# Patient Record
Sex: Male | Born: 1975 | Race: Black or African American | Hispanic: No | Marital: Married | Smoking: Never smoker
Health system: Southern US, Community
[De-identification: ages and names within clinical notes are randomized; demographics above are authoritative.]

## PROBLEM LIST (undated history)

## (undated) HISTORY — PX: SHOULDER ARTHROSCOPY WITH ROTATOR CUFF REPAIR: SHX5685
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
## 1999-01-14 ENCOUNTER — Emergency Department (HOSPITAL_COMMUNITY): Admission: EM | Admit: 1999-01-14 | Discharge: 1999-01-14 | Payer: Self-pay | Admitting: *Deleted

## 1999-04-11 ENCOUNTER — Emergency Department (HOSPITAL_COMMUNITY): Admission: EM | Admit: 1999-04-11 | Discharge: 1999-04-11 | Payer: Self-pay | Admitting: Emergency Medicine

## 1999-04-12 ENCOUNTER — Encounter: Payer: Self-pay | Admitting: Emergency Medicine

## 2000-03-09 ENCOUNTER — Emergency Department (HOSPITAL_COMMUNITY): Admission: EM | Admit: 2000-03-09 | Discharge: 2000-03-09 | Payer: Self-pay | Admitting: Emergency Medicine

## 2000-03-09 ENCOUNTER — Encounter: Payer: Self-pay | Admitting: Emergency Medicine

## 2007-12-07 ENCOUNTER — Emergency Department (HOSPITAL_COMMUNITY): Admission: EM | Admit: 2007-12-07 | Discharge: 2007-12-07 | Payer: Self-pay | Admitting: Emergency Medicine

## 2008-07-24 ENCOUNTER — Emergency Department (HOSPITAL_COMMUNITY): Admission: EM | Admit: 2008-07-24 | Discharge: 2008-07-24 | Payer: Self-pay | Admitting: Emergency Medicine

## 2010-12-01 ENCOUNTER — Encounter: Admit: 2010-12-01 | Payer: Self-pay | Admitting: Physician Assistant

## 2011-10-12 ENCOUNTER — Ambulatory Visit: Payer: Self-pay | Admitting: Physician Assistant

## 2011-11-09 ENCOUNTER — Ambulatory Visit: Payer: Self-pay | Admitting: Anesthesiology

## 2011-11-09 LAB — BASIC METABOLIC PANEL
Anion Gap: 9 (ref 7–16)
BUN: 13 mg/dL (ref 7–18)
Chloride: 105 mmol/L (ref 98–107)
Creatinine: 0.75 mg/dL (ref 0.60–1.30)
EGFR (African American): >60
Glucose: 247 mg/dL — ABNORMAL HIGH (ref 65–99)
Osmolality: 286 (ref 275–301)
Potassium: 4 mmol/L (ref 3.5–5.1)

## 2011-11-10 ENCOUNTER — Ambulatory Visit: Payer: Self-pay | Admitting: Unknown Physician Specialty

## 2012-03-31 ENCOUNTER — Encounter: Payer: Self-pay | Admitting: Family Medicine

## 2012-03-31 ENCOUNTER — Ambulatory Visit (INDEPENDENT_AMBULATORY_CARE_PROVIDER_SITE_OTHER): Payer: Managed Care, Other (non HMO) | Admitting: Family Medicine

## 2012-03-31 VITALS — BP 120/70 | HR 76 | Temp 98.9°F | Wt 214.0 lb

## 2012-03-31 DIAGNOSIS — E119 Type 2 diabetes mellitus without complications: Secondary | ICD-10-CM | POA: Insufficient documentation

## 2012-03-31 DIAGNOSIS — Z136 Encounter for screening for cardiovascular disorders: Secondary | ICD-10-CM

## 2012-03-31 LAB — LIPID PANEL: Cholesterol: 185 mg/dL (ref 0–200)

## 2012-03-31 LAB — HEMOGLOBIN A1C: Hgb A1c MFr Bld: 7.6 % — ABNORMAL HIGH (ref 4.6–6.5)

## 2012-03-31 MED ORDER — GLIPIZIDE 5 MG PO TABS: 5.0000 mg | ORAL_TABLET | Freq: Every day | ORAL | Status: DC

## 2012-03-31 NOTE — Patient Instructions (Signed)
It was nice to meet you. We are checking labs today. We will call you with your lab results.

## 2012-03-31 NOTE — Progress Notes (Signed)
  Subjective:    Patient ID: Luke Abbott, male    DOB: 04/10/1976, 36 y.o.   MRN: [id number]  HPI  36 yo here to establish care.  DM- diagnosed in 2006. Does have yearly eye exams. Denies any radiculopathy or neuropathy. Does not check CBGs regularly.   On Metformin 1000 mg twice daily and glipizide 5 mg daily. Denies any episodes of hypoglycemia.  Unsure when his a1c was last checked.  Patient Active Problem List  Diagnoses  . Diabetes mellitus   Past Medical History  Diagnosis Date  . Diabetes mellitus    No past surgical history on file. History  Substance Use Topics  . Smoking status: Never Smoker   . Smokeless tobacco: Not on file  . Alcohol Use: Not on file   Family History  Problem Relation Age of Onset  . Hypertension Father    No Known Allergies Current Outpatient Prescriptions on File Prior to Visit  Medication Sig Dispense Refill  . glipiZIDE (GLUCOTROL) 5 MG tablet Take 1 tablet (5 mg total) by mouth daily.  30 tablet  11  . metFORMIN (GLUCOPHAGE) 1000 MG tablet Take 1,000 mg by mouth 2 (two) times daily with a meal.       The PMH, PSH, Social History, Family History, Medications, and allergies have been reviewed in [facility], and have been updated if relevant.   Review of Systems See HPI Patient reports no  vision/ hearing changes,anorexia, weight change, fever ,adenopathy, persistant / recurrent hoarseness, swallowing issues, chest pain, edema,persistant / recurrent cough, hemoptysis, dyspnea(rest, exertional, paroxysmal nocturnal), gastrointestinal  bleeding (melena, rectal bleeding), abdominal pain, excessive heart burn, GU symptoms(dysuria, hematuria, pyuria, voiding/incontinence  Issues) syncope, focal weakness, severe memory loss, concerning skin lesions, depression, anxiety, abnormal bruising/bleeding, major joint swelling.       Objective:   Physical Exam BP 120/70  Pulse 76  Temp(Src) 98.9 F (37.2 C) (Oral)  Wt 214 lb (97.07 kg) General:   overweght male in NAD Eyes:  PERRL Ears:  External ear exam shows no significant lesions or deformities.  Otoscopic examination reveals clear canals, tympanic membranes are intact bilaterally without bulging, retraction, inflammation or discharge. Hearing is grossly normal bilaterally. Nose:  External nasal examination shows no deformity or inflammation. Nasal mucosa are pink and moist without lesions or exudates. Mouth:  Oral mucosa and oropharynx without lesions or exudates.  Teeth in good repair. Neck:  no carotid bruit or thyromegaly no cervical or supraclavicular lymphadenopathy  Lungs:  Normal respiratory effort, chest expands symmetrically. Lungs are clear to auscultation, no crackles or wheezes. Heart:  Normal rate and regular rhythm. S1 and S2 normal without gallop, murmur, click, rub or other extra sounds. Abdomen:  Bowel sounds positive,abdomen soft and non-tender without masses, organomegaly or hernias noted. Pulses:  R and L posterior tibial pulses are full and equal bilaterally  Extremities:  no edema  Diabetic foot exam: Normal inspection No skin breakdown No calluses  Normal DP pulses Normal sensation to light touch and monofilament Nails normal     Assessment & Plan:   1. Diabetes mellitus  Recheck a1c today. No adjustments to medications today- await a1c results. Hemoglobin A1c  2. Screening for ischemic heart disease  Lipid Panel

## 2012-06-21 ENCOUNTER — Other Ambulatory Visit: Payer: Self-pay | Admitting: Family Medicine

## 2012-06-21 DIAGNOSIS — E119 Type 2 diabetes mellitus without complications: Secondary | ICD-10-CM

## 2012-06-27 ENCOUNTER — Other Ambulatory Visit: Payer: Managed Care, Other (non HMO)

## 2012-07-23 ENCOUNTER — Other Ambulatory Visit: Payer: Self-pay | Admitting: Family Medicine

## 2012-10-21 ENCOUNTER — Other Ambulatory Visit: Payer: Self-pay | Admitting: Family Medicine

## 2013-01-12 ENCOUNTER — Other Ambulatory Visit: Payer: Self-pay | Admitting: *Deleted

## 2013-01-12 MED ORDER — METFORMIN HCL 1000 MG PO TABS: ORAL_TABLET | ORAL | Status: DC

## 2013-04-05 ENCOUNTER — Emergency Department (INDEPENDENT_AMBULATORY_CARE_PROVIDER_SITE_OTHER): Payer: Managed Care, Other (non HMO)

## 2013-04-05 ENCOUNTER — Emergency Department (HOSPITAL_COMMUNITY)
Admission: EM | Admit: 2013-04-05 | Discharge: 2013-04-05 | Disposition: A | Discharge status: Home / Self Care | Payer: Managed Care, Other (non HMO) | Source: Home / Self Care | Attending: Family Medicine | Admitting: Family Medicine

## 2013-04-05 ENCOUNTER — Encounter (HOSPITAL_COMMUNITY): Payer: Self-pay | Admitting: Emergency Medicine

## 2013-04-05 DIAGNOSIS — M79609 Pain in unspecified limb: Secondary | ICD-10-CM

## 2013-04-05 DIAGNOSIS — M79644 Pain in right finger(s): Secondary | ICD-10-CM

## 2013-04-05 MED ORDER — KETOROLAC TROMETHAMINE 60 MG/2ML IM SOLN
INTRAMUSCULAR | Status: AC
  Filled 2013-04-05: qty 2

## 2013-04-05 MED ORDER — KETOROLAC TROMETHAMINE 60 MG/2ML IM SOLN
60.0000 mg | Freq: Once | INTRAMUSCULAR | Status: AC
  Administered 2013-04-05: 60 mg via INTRAMUSCULAR

## 2013-04-05 MED ORDER — CEPHALEXIN 500 MG PO CAPS: 1000.0000 mg | ORAL_CAPSULE | Freq: Three times a day (TID) | ORAL | Status: DC

## 2013-04-05 MED ORDER — NAPROXEN 500 MG PO TABS: 500.0000 mg | ORAL_TABLET | Freq: Two times a day (BID) | ORAL | Status: DC

## 2013-04-05 NOTE — ED Provider Notes (Signed)
History     CSN: [id number]  Arrival date & time 04/05/13  1543   First MD Initiated Contact with Patient 04/05/13 1640      Chief Complaint  Patient presents with  . Finger Injury    (Consider location/radiation/quality/duration/timing/severity/associated sxs/prior treatment) HPI Comments: Pt presents c/o pain in the DIP of his middle finger on his right hand.  He was at work and smashed it and now thinks it has gotten infected.  He has pain and tenderness from the PIP through the end of his finger, and is increased by attempting to flex the DIP.  He thinks there has been pus coming out of it as well.  Denies any fever, chills, or any injury elsewhere.     Past Medical History  Diagnosis Date  . Diabetes mellitus     History reviewed. No pertinent past surgical history.  Family History  Problem Relation Age of Onset  . Hypertension Father     History  Substance Use Topics  . Smoking status: Never Smoker   . Smokeless tobacco: Not on file  . Alcohol Use: No      Review of Systems  Constitutional: Negative for fever, chills and fatigue.  HENT: Negative for sore throat, neck pain and neck stiffness.   Eyes: Negative for visual disturbance.  Respiratory: Negative for cough and shortness of breath.   Cardiovascular: Negative for chest pain, palpitations and leg swelling.  Gastrointestinal: Negative for nausea, vomiting, abdominal pain, diarrhea and constipation.  Genitourinary: Negative for dysuria, urgency, frequency and hematuria.  Musculoskeletal: Positive for arthralgias (see HPI ). Negative for myalgias.  Skin: Negative for rash.  Neurological: Negative for dizziness, weakness and light-headedness.    Allergies  Review of patient's allergies indicates no known allergies.  Home Medications   Current Outpatient Rx  Name  Route  Sig  Dispense  Refill  . glipiZIDE (GLUCOTROL) 5 MG tablet   Oral   Take 1 tablet (5 mg total) by mouth daily.   30 tablet   11    . metFORMIN (GLUCOPHAGE) 1000 MG tablet      TAKE 1 TABLET BY MOUTH TWICE A DAY * Needs appointment with Dr for any additional refills*   60 tablet   0   . cephALEXin (KEFLEX) 500 MG capsule   Oral   Take 2 capsules (1,000 mg total) by mouth 3 (three) times daily.   30 capsule   0   . naproxen (NAPROSYN) 500 MG tablet   Oral   Take 1 tablet (500 mg total) by mouth 2 (two) times daily.   30 tablet   0     BP 126/66  Pulse 83  Temp(Src) 97.9 F (36.6 C) (Oral)  Resp 16  SpO2 99%  Physical Exam  Constitutional: He is oriented to person, place, and time. He appears well-developed and well-nourished. No distress.  HENT:  Head: Normocephalic and atraumatic.  Eyes: EOM are normal. Pupils are equal, round, and reactive to light.  Cardiovascular: Normal rate and regular rhythm.  Exam reveals no gallop and no friction rub.   No murmur heard. Pulmonary/Chest: Effort normal and breath sounds normal. No respiratory distress. He has no wheezes. He has no rales.  Abdominal: Soft. There is no tenderness.  Musculoskeletal:  Tenderness on the 3rd digit or right hand from the PIP through the end of the finger, worst at the DIP.  Mild erythema and a small break in the skin overlying the dorsal surface of the DIP.  No palpable crepitus and no ligamentous laxity.    Neurological: He is oriented to person, place, and time.  Skin: Skin is warm and dry. No rash noted.  Psychiatric: He has a normal mood and affect. Judgment normal.    ED Course  Procedures (including critical care time)  Labs Reviewed - No data to display Dg Hand Complete Right  04/05/2013   *RADIOLOGY REPORT*  Clinical Data: Injury.  RIGHT HAND - COMPLETE 3+ VIEW  Comparison: 12/07/2007  Findings: There is no evidence of fracture or dislocation.  There is no evidence of arthropathy or other focal bone abnormality. Soft tissues are unremarkable.  IMPRESSION: Negative exam.   Original Report Authenticated By: Signa Kell,  M.D.     1. Finger pain, right       MDM  Will treat this for the inflammation but as pt is quite concerned that it may be infected and I cannot say definitely that it is not infected, will use a short course of keflex as well.  Advised ice and elevation as well.  F/u if pain and swelling increase   Meds ordered this encounter  Medications  . ketorolac (TORADOL) injection 60 mg    Sig:   . naproxen (NAPROSYN) 500 MG tablet    Sig: Take 1 tablet (500 mg total) by mouth 2 (two) times daily.    Dispense:  30 tablet    Refill:  0  . cephALEXin (KEFLEX) 500 MG capsule    Sig: Take 2 capsules (1,000 mg total) by mouth 3 (three) times daily.    Dispense:  30 capsule    Refill:  0           Graylon Good, PA-C 04/05/13 1908

## 2013-04-05 NOTE — ED Notes (Signed)
Pt c/o right hand middle finger injury yesterday. Smashed his finger in his line of work. Has used alcohol, pressure and neosporin. Feels like bruise is forming under the skin. Was leaking pus and blood yesterday. Is able to bend slightly, not as much as other fingers. Pt is alert and oriented.

## 2013-04-17 NOTE — ED Provider Notes (Signed)
Medical screening examination/treatment/procedure(s) were performed by resident physician or non-physician practitioner and as supervising physician I was immediately available for consultation/collaboration.   Barkley Bruns MD.   Linna Hoff, MD 04/17/13 2024

## 2013-04-20 ENCOUNTER — Ambulatory Visit (INDEPENDENT_AMBULATORY_CARE_PROVIDER_SITE_OTHER): Payer: Managed Care, Other (non HMO) | Admitting: Family Medicine

## 2013-04-20 ENCOUNTER — Encounter: Payer: Self-pay | Admitting: Family Medicine

## 2013-04-20 VITALS — BP 114/82 | HR 72 | Temp 97.9°F | Wt 206.0 lb

## 2013-04-20 DIAGNOSIS — E119 Type 2 diabetes mellitus without complications: Secondary | ICD-10-CM

## 2013-04-20 DIAGNOSIS — R209 Unspecified disturbances of skin sensation: Secondary | ICD-10-CM

## 2013-04-20 DIAGNOSIS — R200 Anesthesia of skin: Secondary | ICD-10-CM | POA: Insufficient documentation

## 2013-04-20 LAB — HEMOGLOBIN A1C: Hgb A1c MFr Bld: 9.6 % — ABNORMAL HIGH (ref 4.6–6.5)

## 2013-04-20 LAB — COMPREHENSIVE METABOLIC PANEL
ALT: 17 U/L (ref 0–53)
AST: 13 U/L (ref 0–37)
Albumin: 3.8 g/dL (ref 3.5–5.2)
Alkaline Phosphatase: 87 U/L (ref 39–117)
BUN: 14 mg/dL (ref 6–23)
Calcium: 9 mg/dL (ref 8.4–10.5)
Chloride: 103 mEq/L (ref 96–112)
Creatinine, Ser: 0.8 mg/dL (ref 0.4–1.5)
Potassium: 4.2 mEq/L (ref 3.5–5.1)

## 2013-04-20 MED ORDER — GLIPIZIDE 5 MG PO TABS: 5.0000 mg | ORAL_TABLET | Freq: Two times a day (BID) | ORAL | Status: DC

## 2013-04-20 NOTE — Addendum Note (Signed)
Addended by: Dianne Dun on: 04/20/2013 12:18 PM   Modules accepted: Orders

## 2013-04-20 NOTE — Patient Instructions (Addendum)
Good to see you. I will call you with your lab results.  Make sure you are drinking plenty of water.  If you drink electrolyte drinks, get sugar free ones not Power Aid.

## 2013-04-20 NOTE — Progress Notes (Signed)
Subjective:    Patient ID: Luke Abbott, male    DOB: 29-Jun-1976, 37 y.o.   MRN: [id number]  HPI  36 here to discuss several issues.  Has not been seen here since he established care over a year ago.  He did not return for his 3 month follow up.  DM- diagnosed in 2006. Does have yearly eye exams. Denies any radiculopathy or neuropathy. Does not check CBGs regularly.   When he does check them, usually 160. On Metformin 1000 mg twice daily and glipizide 5 mg daily. Denies any episodes of hypoglycemia. Has been exercising and has lost weight.  He works in heat all day.  He drinks mainly Poweraid.  Has noticed some more nausea recently. Lab Results  Component Value Date   HGBA1C 7.6* 03/31/2012   Wt Readings from Last 3 Encounters:  04/20/13 206 lb (93.441 kg)  03/31/12 214 lb (97.07 kg)   Right leg numbness- past few months,  On and off numbness in right lower leg, none in feet.  No pain.  No known injury but does have h/o chronic low back pain since a fall he had years ago.  Patient Active Problem List   Diagnosis Date Noted  . Diabetes mellitus    Past Medical History  Diagnosis Date  . Diabetes mellitus    No past surgical history on file. History  Substance Use Topics  . Smoking status: Never Smoker   . Smokeless tobacco: Not on file  . Alcohol Use: No   Family History  Problem Relation Age of Onset  . Hypertension Father    No Known Allergies Current Outpatient Prescriptions on File Prior to Visit  Medication Sig Dispense Refill  . cephALEXin (KEFLEX) 500 MG capsule Take 2 capsules (1,000 mg total) by mouth 3 (three) times daily.  30 capsule  0  . glipiZIDE (GLUCOTROL) 5 MG tablet Take 1 tablet (5 mg total) by mouth daily.  30 tablet  11  . metFORMIN (GLUCOPHAGE) 1000 MG tablet TAKE 1 TABLET BY MOUTH TWICE A DAY * Needs appointment with Dr for any additional refills*  60 tablet  0  . naproxen (NAPROSYN) 500 MG tablet Take 1 tablet (500 mg total) by mouth 2  (two) times daily.  30 tablet  0   No current facility-administered medications on file prior to visit.   The PMH, PSH, Social History, Family History, Medications, and allergies have been reviewed in [facility], and have been updated if relevant.   Review of Systems See HPI Has had some nausea in the heat    Objective:   Physical Exam BP 114/82  Pulse 72  Temp(Src) 97.9 F (36.6 C)  Wt 206 lb (93.441 kg) General:  overweght male in NAD Eyes:  PERRL Ears:  External ear exam shows no significant lesions or deformities.  Otoscopic examination reveals clear canals, tympanic membranes are intact bilaterally without bulging, retraction, inflammation or discharge. Hearing is grossly normal bilaterally. Nose:  External nasal examination shows no deformity or inflammation. Nasal mucosa are pink and moist without lesions or exudates. Mouth:  Oral mucosa and oropharynx without lesions or exudates.  Teeth in good repair. Neck:  no carotid bruit or thyromegaly no cervical or supraclavicular lymphadenopathy  Lungs:  Normal respiratory effort, chest expands symmetrically. Lungs are clear to auscultation, no crackles or wheezes. Heart:  Normal rate and regular rhythm. S1 and S2 normal without gallop, murmur, click, rub or other extra sounds. Abdomen:  Bowel sounds positive,abdomen soft and non-tender without  masses, organomegaly or hernias noted. Pulses:  R and L posterior tibial pulses are full and equal bilaterally  Extremities:  no edema  Diabetic foot exam: Normal inspection No skin breakdown No calluses  Normal DP pulses Normal sensation to light touch and monofilament Nails normal     Assessment & Plan:   1. Diabetes mellitus Likely deteriorated.  Will check a1c today. Will likely need to add another agent.  I have advised him to STOP drinking sugary drinks and to drink more water. Check kidney function. - Hemoglobin A1c - Comprehensive metabolic panel  2. Right leg numbness Sciatia  vs diabetic neuropathy. Awaiting labs.

## 2013-07-19 ENCOUNTER — Ambulatory Visit: Payer: Managed Care, Other (non HMO) | Admitting: Family Medicine

## 2013-07-19 DIAGNOSIS — Z0289 Encounter for other administrative examinations: Secondary | ICD-10-CM

## 2013-10-02 ENCOUNTER — Inpatient Hospital Stay (HOSPITAL_COMMUNITY)
Admission: EM | Admit: 2013-10-02 | Discharge: 2013-10-06 | DRG: 603 | Disposition: A | Discharge status: Home / Self Care | Payer: Managed Care, Other (non HMO) | Attending: Internal Medicine | Admitting: Internal Medicine

## 2013-10-02 ENCOUNTER — Encounter (HOSPITAL_COMMUNITY): Payer: Self-pay | Admitting: Emergency Medicine

## 2013-10-02 DIAGNOSIS — L0291 Cutaneous abscess, unspecified: Secondary | ICD-10-CM | POA: Diagnosis present

## 2013-10-02 DIAGNOSIS — A419 Sepsis, unspecified organism: Secondary | ICD-10-CM

## 2013-10-02 DIAGNOSIS — Z8249 Family history of ischemic heart disease and other diseases of the circulatory system: Secondary | ICD-10-CM

## 2013-10-02 DIAGNOSIS — R739 Hyperglycemia, unspecified: Secondary | ICD-10-CM

## 2013-10-02 DIAGNOSIS — L02213 Cutaneous abscess of chest wall: Secondary | ICD-10-CM

## 2013-10-02 DIAGNOSIS — L02219 Cutaneous abscess of trunk, unspecified: Principal | ICD-10-CM | POA: Diagnosis present

## 2013-10-02 DIAGNOSIS — I10 Essential (primary) hypertension: Secondary | ICD-10-CM | POA: Diagnosis present

## 2013-10-02 DIAGNOSIS — A4902 Methicillin resistant Staphylococcus aureus infection, unspecified site: Secondary | ICD-10-CM | POA: Diagnosis present

## 2013-10-02 DIAGNOSIS — R6510 Systemic inflammatory response syndrome (SIRS) of non-infectious origin without acute organ dysfunction: Secondary | ICD-10-CM

## 2013-10-02 DIAGNOSIS — Z79899 Other long term (current) drug therapy: Secondary | ICD-10-CM

## 2013-10-02 DIAGNOSIS — E119 Type 2 diabetes mellitus without complications: Secondary | ICD-10-CM

## 2013-10-02 LAB — GLUCOSE, CAPILLARY

## 2013-10-02 NOTE — ED Notes (Signed)
Pt reports getting a tattoo on 11/10 to his R upper chest, reports that he has had boils form on his chest since then that have opened and turned red and warm around the area. Pt used Neosporin and placed a bandage on opened area.

## 2013-10-03 ENCOUNTER — Encounter (HOSPITAL_COMMUNITY): Payer: Self-pay | Admitting: Internal Medicine

## 2013-10-03 DIAGNOSIS — L02219 Cutaneous abscess of trunk, unspecified: Principal | ICD-10-CM

## 2013-10-03 DIAGNOSIS — R6510 Systemic inflammatory response syndrome (SIRS) of non-infectious origin without acute organ dysfunction: Secondary | ICD-10-CM

## 2013-10-03 DIAGNOSIS — L0291 Cutaneous abscess, unspecified: Secondary | ICD-10-CM | POA: Diagnosis present

## 2013-10-03 DIAGNOSIS — E119 Type 2 diabetes mellitus without complications: Secondary | ICD-10-CM

## 2013-10-03 LAB — CBC WITH DIFFERENTIAL/PLATELET
Eosinophils Relative: 1 % (ref 0–5)
HCT: 43.6 % (ref 39.0–52.0)
Hemoglobin: 14.8 g/dL (ref 13.0–17.0)
Lymphocytes Relative: 16 % (ref 12–46)
Lymphs Abs: 2 10*3/uL (ref 0.7–4.0)
MCV: 83.2 fL (ref 78.0–100.0)
Monocytes Absolute: 1 10*3/uL (ref 0.1–1.0)
Monocytes Relative: 8 % (ref 3–12)
Neutro Abs: 9.5 10*3/uL — ABNORMAL HIGH (ref 1.7–7.7)
WBC: 12.6 10*3/uL — ABNORMAL HIGH (ref 4.0–10.5)

## 2013-10-03 LAB — POCT I-STAT, CHEM 8
Calcium, Ion: 1.2 mmol/L (ref 1.12–1.23)
Chloride: 99 mEq/L (ref 96–112)
Glucose, Bld: 334 mg/dL — ABNORMAL HIGH (ref 70–99)
HCT: 50 % (ref 39.0–52.0)
TCO2: 24 mmol/L (ref 0–100)

## 2013-10-03 LAB — URINE MICROSCOPIC-ADD ON

## 2013-10-03 LAB — HEMOGLOBIN A1C
Hgb A1c MFr Bld: 11.5 % — ABNORMAL HIGH (ref <5.7)
Mean Plasma Glucose: 283 mg/dL — ABNORMAL HIGH (ref <117)

## 2013-10-03 LAB — URINALYSIS, ROUTINE W REFLEX MICROSCOPIC
Bilirubin Urine: NEGATIVE
Glucose, UA: >1000 mg/dL — AB
Hgb urine dipstick: NEGATIVE
Ketones, ur: 15 mg/dL — AB
Nitrite: NEGATIVE
Specific Gravity, Urine: >1.046 — ABNORMAL HIGH (ref 1.005–1.030)
pH: 5.5 (ref 5.0–8.0)

## 2013-10-03 LAB — GLUCOSE, CAPILLARY
Glucose-Capillary: 257 mg/dL — ABNORMAL HIGH (ref 70–99)
Glucose-Capillary: 245 mg/dL — ABNORMAL HIGH (ref 70–99)

## 2013-10-03 LAB — CREATININE, SERUM: Creatinine, Ser: 0.86 mg/dL (ref 0.50–1.35)

## 2013-10-03 MED ORDER — INSULIN ASPART 100 UNIT/ML ~~LOC~~ SOLN
0.0000 [IU] | Freq: Three times a day (TID) | SUBCUTANEOUS | Status: DC
  Administered 2013-10-03: 5 [IU] via SUBCUTANEOUS
  Administered 2013-10-03: 3 [IU] via SUBCUTANEOUS
  Administered 2013-10-03: 8 [IU] via SUBCUTANEOUS
  Administered 2013-10-04: 17:00:00 3 [IU] via SUBCUTANEOUS
  Administered 2013-10-04 – 2013-10-05 (×3): 5 [IU] via SUBCUTANEOUS
  Administered 2013-10-06: 3 [IU] via SUBCUTANEOUS

## 2013-10-03 MED ORDER — ENOXAPARIN SODIUM 40 MG/0.4ML ~~LOC~~ SOLN
40.0000 mg | SUBCUTANEOUS | Status: DC
  Administered 2013-10-03 – 2013-10-05 (×3): 40 mg via SUBCUTANEOUS
  Filled 2013-10-03 (×3): qty 0.4

## 2013-10-03 MED ORDER — INSULIN ASPART 100 UNIT/ML ~~LOC~~ SOLN
0.0000 [IU] | Freq: Every day | SUBCUTANEOUS | Status: DC
  Administered 2013-10-03 – 2013-10-04 (×2): 2 [IU] via SUBCUTANEOUS
  Administered 2013-10-05: 3 [IU] via SUBCUTANEOUS

## 2013-10-03 MED ORDER — ONDANSETRON HCL 4 MG PO TABS: 4.0000 mg | ORAL_TABLET | Freq: Four times a day (QID) | ORAL | Status: DC | PRN

## 2013-10-03 MED ORDER — MORPHINE SULFATE 4 MG/ML IJ SOLN
2.0000 mg | Freq: Once | INTRAMUSCULAR | Status: AC
  Administered 2013-10-03: 2 mg via INTRAVENOUS
  Filled 2013-10-03: qty 1

## 2013-10-03 MED ORDER — ONDANSETRON HCL 4 MG/2ML IJ SOLN: 4.0000 mg | Freq: Four times a day (QID) | INTRAMUSCULAR | Status: DC | PRN

## 2013-10-03 MED ORDER — INSULIN GLARGINE 100 UNIT/ML ~~LOC~~ SOLN
12.0000 [IU] | Freq: Every day | SUBCUTANEOUS | Status: DC
  Administered 2013-10-03 – 2013-10-05 (×3): 12 [IU] via SUBCUTANEOUS
  Filled 2013-10-03 (×4): qty 0.12

## 2013-10-03 MED ORDER — GLIPIZIDE 5 MG PO TABS
5.0000 mg | ORAL_TABLET | Freq: Every day | ORAL | Status: DC
  Administered 2013-10-03 – 2013-10-06 (×4): 5 mg via ORAL
  Filled 2013-10-03 (×5): qty 1

## 2013-10-03 MED ORDER — VANCOMYCIN HCL IN DEXTROSE 1-5 GM/200ML-% IV SOLN
1000.0000 mg | Freq: Once | INTRAVENOUS | Status: AC
  Administered 2013-10-03: 1000 mg via INTRAVENOUS
  Filled 2013-10-03: qty 200

## 2013-10-03 MED ORDER — ACETAMINOPHEN 325 MG PO TABS: 650.0000 mg | ORAL_TABLET | Freq: Four times a day (QID) | ORAL | Status: DC | PRN

## 2013-10-03 MED ORDER — HYDROCODONE-ACETAMINOPHEN 5-325 MG PO TABS
1.0000 | ORAL_TABLET | ORAL | Status: DC | PRN
  Administered 2013-10-03 (×2): 2 via ORAL
  Filled 2013-10-03 (×2): qty 2

## 2013-10-03 MED ORDER — SODIUM CHLORIDE 0.9 % IV SOLN
1000.0000 mL | INTRAVENOUS | Status: DC
  Administered 2013-10-03 – 2013-10-06 (×7): 1000 mL via INTRAVENOUS

## 2013-10-03 MED ORDER — PIPERACILLIN-TAZOBACTAM 3.375 G IVPB 30 MIN
3.3750 g | Freq: Once | INTRAVENOUS | Status: AC
  Administered 2013-10-03: 3.375 g via INTRAVENOUS
  Filled 2013-10-03: qty 50

## 2013-10-03 MED ORDER — SODIUM CHLORIDE 0.9 % IJ SOLN: 3.0000 mL | Freq: Two times a day (BID) | INTRAMUSCULAR | Status: DC

## 2013-10-03 MED ORDER — VANCOMYCIN HCL IN DEXTROSE 1-5 GM/200ML-% IV SOLN
1000.0000 mg | Freq: Two times a day (BID) | INTRAVENOUS | Status: DC
  Administered 2013-10-03 – 2013-10-05 (×6): 1000 mg via INTRAVENOUS
  Filled 2013-10-03 (×8): qty 200

## 2013-10-03 MED ORDER — SODIUM CHLORIDE 0.9 % IV BOLUS (SEPSIS)
1000.0000 mL | Freq: Once | INTRAVENOUS | Status: AC
  Administered 2013-10-03: 1000 mL via INTRAVENOUS

## 2013-10-03 MED ORDER — ACETAMINOPHEN 500 MG PO TABS
1000.0000 mg | ORAL_TABLET | Freq: Once | ORAL | Status: AC
  Administered 2013-10-03: 1000 mg via ORAL
  Filled 2013-10-03: qty 2

## 2013-10-03 MED ORDER — ACETAMINOPHEN 650 MG RE SUPP: 650.0000 mg | Freq: Four times a day (QID) | RECTAL | Status: DC | PRN

## 2013-10-03 MED ORDER — PIPERACILLIN-TAZOBACTAM 3.375 G IVPB
3.3750 g | Freq: Three times a day (TID) | INTRAVENOUS | Status: DC
  Administered 2013-10-03 – 2013-10-05 (×7): 3.375 g via INTRAVENOUS
  Filled 2013-10-03 (×10): qty 50

## 2013-10-03 MED ORDER — DOCUSATE SODIUM 100 MG PO CAPS
100.0000 mg | ORAL_CAPSULE | Freq: Two times a day (BID) | ORAL | Status: DC
  Administered 2013-10-03 – 2013-10-06 (×5): 100 mg via ORAL
  Filled 2013-10-03 (×8): qty 1

## 2013-10-03 MED ORDER — SODIUM CHLORIDE 0.9 % IV SOLN
1000.0000 mL | Freq: Once | INTRAVENOUS | Status: AC
  Administered 2013-10-03: 1000 mL via INTRAVENOUS

## 2013-10-03 NOTE — ED Notes (Signed)
Dr. Doutova at bedside.  

## 2013-10-03 NOTE — ED Provider Notes (Signed)
See prior note   Ward Givens, MD 10/03/13 [phone]

## 2013-10-03 NOTE — ED Notes (Signed)
POCT CG4 given to Dr. Lynelle Doctor.

## 2013-10-03 NOTE — Progress Notes (Signed)
Inpatient Diabetes Program Recommendations  AACE/ADA: New Consensus Statement on Inpatient Glycemic Control (2013)  Target Ranges:  Prepandial:   less than 140 mg/dL      Peak postprandial:   less than 180 mg/dL (1-2 hours)      Critically ill patients:  140 - 180 mg/dL   Reason for Visit: Hyperglycemia  37 year old male admitted with skin abscess.  Hx Type 2 DM on metformin 1000 mg bid and glipizide 5 mg QDAC at home.  Results for CAZ, WEAVER (MRN [id number]) as of 10/03/2013 11:25  Ref. Range 10/03/2013 02:27  Glucose Latest Range: 70-99 mg/dL 409 (H)   Results for JOHNEY, PEROTTI (MRN [id number]) as of 10/03/2013 11:25  Ref. Range 10/02/2013 23:24 10/03/2013 06:46 10/03/2013 07:28  Glucose-Capillary Latest Range: 70-99 mg/dL 956 (H) 213 (H) 086 (H)   Hyperglycemia with pending HgbA1C.   Inpatient Diabetes Program Recommendations Insulin - Basal: Consider addition of basal insulin - Lantus 15 units QHS Correction (SSI): Increase Novolog to moderate tidwc and hs Oral Agents: Continue home medication of glipizide 5 mg QDAC HgbA1C: Pending Outpatient Referral: Will likely need OP Diabetes Education  Note: Will follow while inpatient. Thank you. Ailene Ards, RD, LDN, CDE Inpatient Diabetes Coordinator [phone]

## 2013-10-03 NOTE — Progress Notes (Signed)
ANTIBIOTIC CONSULT NOTE - INITIAL  Pharmacy Consult for Vancomycin and Zosyn  Indication: rule out sepsis  No Known Allergies  Patient Measurements: Height: 6' (182.9 cm) Weight: 230 lb (104.327 kg) IBW/kg (Calculated) : 77.6 Adjusted Body Weight:   Vital Signs: Temp: 101.9 F (38.8 C) (12/02 0203) Temp src: Oral (12/01 2319) BP: 138/84 mmHg (12/02 0203) Pulse Rate: 103 (12/02 0203) Intake/Output from previous day:   Intake/Output from this shift:    Labs:  Recent Labs  10/03/13 0210 10/03/13 0227  WBC 12.6*  --   HGB 14.8 17.0  PLT 218  --   CREATININE  --  1.10   Estimated Creatinine Clearance: 113.7 ml/min (by C-G formula based on Cr of 1.1). No results found for this basename: VANCOTROUGH, VANCOPEAK, VANCORANDOM, GENTTROUGH, GENTPEAK, GENTRANDOM, TOBRATROUGH, TOBRAPEAK, TOBRARND, AMIKACINPEAK, AMIKACINTROU, AMIKACIN,  in the last 72 hours   Microbiology: No results found for this or any previous visit (from the past 720 hour(s)).  Medical History: Past Medical History  Diagnosis Date  . Diabetes mellitus     Medications:  Anti-infectives   Start     Dose/Rate Route Frequency Ordered Stop   10/03/13 1000  vancomycin (VANCOCIN) IVPB 1000 mg/200 mL premix     1,000 mg 200 mL/hr over 60 Minutes Intravenous Every 12 hours 10/03/13 0255     10/03/13 0800  piperacillin-tazobactam (ZOSYN) IVPB 3.375 g     3.375 g 12.5 mL/hr over 240 Minutes Intravenous 3 times per day 10/03/13 0255     10/03/13 0300  piperacillin-tazobactam (ZOSYN) IVPB 3.375 g     3.375 g 100 mL/hr over 30 Minutes Intravenous  Once 10/03/13 0246     10/03/13 0300  vancomycin (VANCOCIN) IVPB 1000 mg/200 mL premix     1,000 mg 200 mL/hr over 60 Minutes Intravenous  Once 10/03/13 0246       Assessment: Patient with sepsis.  First dose of antibiotics already sent to ED.  Goal of Therapy:  Vancomycin trough level 15-20 mcg/ml Zosyn based on renal function   Plan:  Measure antibiotic  drug levels at steady state Follow up culture results Vancomycin 1gm iv q12hr Zosyn 3.375g IV Q8H infused over 4hrs.   Darlina Guys, Jacquenette Shone Crowford 10/03/2013,2:58 AM

## 2013-10-03 NOTE — ED Provider Notes (Signed)
Patient reports he got a tattoo on his right upper chest on [birth year] for [birth year]. He states last week he started noticing pain in his right chest when he moved his right arm and  afterwards developed some swelling in his upper right chest. He reports it has been draining pus and has gotten smaller. He reports he started getting another area on his medial chest and yesterday started having fever and chills. Patient has diabetes managed with oral medications. He states he has had increased thirst but has not had polyuria. He states he's never had abscesses in the past.   Pt has a 2 cm area (, swelling and induration in his right upper chest that has a scabbed area. He has a larger area on his mid medial right chest that is approximally 6 cm in size again raised, red and warm to touch.  Medical screening examination/treatment/procedure(s) were conducted as a shared visit with non-physician practitioner(s) and myself.  I personally evaluated the patient during the encounter.  EKG Interpretation   None        Devoria Albe, MD, Armando Gang   Ward Givens, MD 10/03/13 [phone]

## 2013-10-03 NOTE — H&P (Signed)
PCP:  [name], MD    Chief Complaint:  swelling on the chest  HPI: Luke Abbott is a 37 y.o. male   has a past medical history of Diabetes mellitus.   Presented with  Two areas of swelling on the chest for the past 1 week. He was not feeling well and had severe pain. Presented to ER and was found to be febrile up to 101.9. With two abscesses on his chest that were drained. Patient is diabetic controlled by metformin. Denies any chest pain. NO SOB.  Hospitalist asked to admit given history of fever tachycardia in a diabetic patient  worrisome for early sepsis.   Review of Systems:    Pertinent positives include:  Fevers, chills, fatigue,   Constitutional:  No weight loss, night sweats,weight loss  HEENT:  No headaches, Difficulty swallowing,Tooth/dental problems,Sore throat,  No sneezing, itching, ear ache, nasal congestion, post nasal drip,  Cardio-vascular:  No chest pain, Orthopnea, PND, anasarca, dizziness, palpitations.no Bilateral lower extremity swelling  GI:  No heartburn, indigestion, abdominal pain, nausea, vomiting, diarrhea, change in bowel habits, loss of appetite, melena, blood in stool, hematemesis Resp:  no shortness of breath at rest. No dyspnea on exertion, No excess mucus, no productive cough, No non-productive cough, No coughing up of blood.No change in color of mucus.No wheezing. Skin:  no rash or lesions. No jaundice GU:  no dysuria, change in color of urine, no urgency or frequency. No straining to urinate.  No flank pain.  Musculoskeletal:  No joint pain or no joint swelling. No decreased range of motion. No back pain.  Psych:  No change in mood or affect. No depression or anxiety. No memory loss.  Neuro: no localizing neurological complaints, no tingling, no weakness, no double vision, no gait abnormality, no slurred speech, no confusion  Otherwise ROS are negative except for above, 10 systems were reviewed  Past Medical History: Past Medical  History  Diagnosis Date  . Diabetes mellitus    History reviewed. No pertinent past surgical history.   Medications: Prior to Admission medications   Medication Sig Start Date End Date Taking? Authorizing Provider  glipiZIDE (GLUCOTROL) 5 MG tablet Take 5 mg by mouth daily before breakfast.   Yes Historical Provider, MD  metFORMIN (GLUCOPHAGE) 1000 MG tablet Take 1,000 mg by mouth 2 (two) times daily with a meal.   Yes Historical Provider, MD    Allergies:  No Known Allergies  Social History:  Ambulatory   Independently  Lives at   Home with family   reports that he has never smoked. He has never used smokeless tobacco. He reports that he drinks alcohol. He reports that he does not use illicit drugs.   Family History: family history includes Hypertension in his father.    Physical Exam: Patient Vitals for the past 24 hrs:  BP Temp Temp src Pulse Resp SpO2 Height Weight  10/03/13 0451 126/73 mmHg 98.7 F (37.1 C) Oral 89 18 100 % - -  10/03/13 0445 - - - 87 - - - -  10/03/13 0203 138/84 mmHg 101.9 F (38.8 C) - 103 18 100 % - -  10/02/13 2319 135/83 mmHg 100.1 F (37.8 C) Oral 101 18 99 % 6' (1.829 m) 104.327 kg (230 lb)    1. General:  in No Acute distress 2. Psychological: Alert and  Oriented 3. Head/ENT:   Moist   Mucous Membranes  Head Non traumatic, neck supple                          Normal Dentition 4. SKIN: normal  Skin turgor,  Skin clean Dry two areas freshly dressed after I&D 5. Heart: Regular rate and rhythm no Murmur, Rub or gallop 6. Lungs: Clear to auscultation bilaterally, no wheezes or crackles   7. Abdomen: Soft, non-tender, Non distended 8. Lower extremities: no clubbing, cyanosis, or edema 9. Neurologically Grossly intact, moving all 4 extremities equally 10. MSK: Normal range of motion  body mass index is 31.19 kg/(m^2).   Labs on Admission:   Recent Labs  10/03/13 0227  NA 137  K 3.8  CL 99  GLUCOSE 334*   BUN 13  CREATININE 1.10   No results found for this basename: AST, ALT, ALKPHOS, BILITOT, PROT, ALBUMIN,  in the last 72 hours No results found for this basename: LIPASE, AMYLASE,  in the last 72 hours  Recent Labs  10/03/13 0210 10/03/13 0227  WBC 12.6*  --   NEUTROABS 9.5*  --   HGB 14.8 17.0  HCT 43.6 50.0  MCV 83.2  --   PLT 218  --    No results found for this basename: CKTOTAL, CKMB, CKMBINDEX, TROPONINI,  in the last 72 hours No results found for this basename: TSH, T4TOTAL, FREET3, T3FREE, THYROIDAB,  in the last 72 hours No results found for this basename: VITAMINB12, FOLATE, FERRITIN, TIBC, IRON, RETICCTPCT,  in the last 72 hours Lab Results  Component Value Date   HGBA1C 9.6* 04/20/2013    Estimated Creatinine Clearance: 113.7 ml/min (by C-G formula based on Cr of 1.1). ABG    Component Value Date/Time   TCO2 24 10/03/2013 0227     No results found for this basename: DDIMER     Other results:     UA glucose   Cultures: No results found for this basename: sdes, specrequest, cult, reptstatus       Radiological Exams on Admission: No results found.  Chart has been reviewed  Assessment/Plan  37 year old gentleman history of diabetes here with 2 chest wall abscesses and evidence of SIRS  Present on Admission:  . Skin abscess - , broadly with vancomycin and Zosyn given the patient is diabetic await results of culture  . Diabetes mellitus - continue glipizide and sliding scale hold metformin  . SIRS (systemic inflammatory response syndrome) - currently patient is improving Will continue IV fluids and IV antibiotics we'll repeat lactic acid. Await results of the blood culture patient is noted to be toxic at this point   Prophylaxis:  Lovenox  CODE STATUS: FULL CODE  Other plan as per orders.  I have spent a total of 55 min on this admission  [name] 10/03/2013, 5:20 AM

## 2013-10-03 NOTE — Progress Notes (Signed)
[facility] PROGRESS NOTE  Luke Abbott WNU:[id number] DOB: 09/24/1976 DOA: 10/02/2013 PCP: [name], MD I have seen and examined pt who is a 37 year old yo male with history significant for diabetes mellitus admitted this am by Dr Adela Glimpse with 2 swollen areas on his chest and found to have abscesses that were drained. Cultures of the abscesses were obtained and he was started on empiric broad-spectrum antibiotics. His blood sugars are on controlled and his hemoglobin A1c is elevated at 11.5, will start Lantus in addition to his glipizide and he will need to be discharged on Lantus given the markedly elevated A1c. We'll continue current management as per Dr. Adela Glimpse, follow cultures and Accu-Cheks and further manage as appropriate.  Kela Millin  [facility] Pager [phone]. If 7PM-7AM, please contact night-coverage at [email or web address], password [facility] 10/03/2013, 1:43 PM  LOS: 1 day

## 2013-10-03 NOTE — ED Provider Notes (Signed)
CSN: [id number]     Arrival date & time 10/02/13  2309 History   First MD Initiated Contact with Patient 10/03/13 0133     Chief Complaint  Patient presents with  . Abscess   (Consider location/radiation/quality/duration/timing/severity/associated sxs/prior Treatment) HPI Comments: Patient is a 37 year old male with a history of diabetes who presents for 2 abscesses to his right upper chest. Symptom onset 3 weeks after getting a tattoo in the area. Patient states that the first abscess appeared 6 days ago and recently began resolving. Second abscess to medial aspect of right breast began 2 days ago and has been gradually worsening since onset. Patient has tried topical warm compresses, topical garlic, and vinegar on abscesses without relief. He states the pain has been gradually worsening which he describes as stabbing and nonradiating. Patient has not taken anything for pain control. Pain is worse with deep breathing, coughing, and palpation to the area. Patient denies associated fever, shortness of breath, nausea or vomiting, numbness/tingling, and weakness. Patient febrile today to 101.63F. Tetanus UTD. Denies hx of MRSA.  Patient is a 37 y.o. male presenting with abscess. The history is provided by the patient. No language interpreter was used.  Abscess   Past Medical History  Diagnosis Date  . Diabetes mellitus    History reviewed. No pertinent past surgical history. Family History  Problem Relation Age of Onset  . Hypertension Father    History  Substance Use Topics  . Smoking status: Never Smoker   . Smokeless tobacco: Never Used  . Alcohol Use: Yes     Comment: socially    Review of Systems  Musculoskeletal: Positive for myalgias.  Skin: Positive for color change.       +abscess x 2 to R upper chest  All other systems reviewed and are negative.    Allergies  Review of patient's allergies indicates no known allergies.  Home Medications   Current Outpatient Rx  Name   Route  Sig  Dispense  Refill  . glipiZIDE (GLUCOTROL) 5 MG tablet   Oral   Take 5 mg by mouth daily before breakfast.         . metFORMIN (GLUCOPHAGE) 1000 MG tablet   Oral   Take 1,000 mg by mouth 2 (two) times daily with a meal.          BP 126/73  Pulse 89  Temp(Src) 98.7 F (37.1 C) (Oral)  Resp 18  Ht 6' (1.829 m)  Wt 230 lb (104.327 kg)  BMI 31.19 kg/m2  SpO2 100%  Physical Exam  Nursing note and vitals reviewed. Constitutional: He is oriented to person, place, and time. He appears well-developed and well-nourished. No distress.  HENT:  Head: Normocephalic and atraumatic.  Mouth/Throat: Oropharynx is clear and moist. No oropharyngeal exudate.  Eyes: Conjunctivae and EOM are normal. Pupils are equal, round, and reactive to light. No scleral icterus.  Neck: Normal range of motion.  Cardiovascular: Regular rhythm, normal heart sounds and intact distal pulses.   Tachy to 102  Pulmonary/Chest: Effort normal and breath sounds normal. No respiratory distress. He has no wheezes. He has no rales.    Abdominal: Soft. He exhibits no distension. There is no tenderness. There is no rebound.  Musculoskeletal: Normal range of motion.  Neurological: He is alert and oriented to person, place, and time.  Skin: Skin is warm and dry. No rash noted. He is not diaphoretic. There is erythema. No pallor.  Psychiatric: He has a normal mood and affect. His  behavior is normal.    ED Course  Procedures (including critical care time) Labs Review Labs Reviewed  GLUCOSE, CAPILLARY - Abnormal; Notable for the following:    Glucose-Capillary 386 (*)    All other components within normal limits  CBC WITH DIFFERENTIAL - Abnormal; Notable for the following:    WBC 12.6 (*)    Neutro Abs 9.5 (*)    All other components within normal limits  URINALYSIS, ROUTINE W REFLEX MICROSCOPIC - Abnormal; Notable for the following:    Specific Gravity, Urine >1.046 (*)    Glucose, UA >1000 (*)     Ketones, ur 15 (*)    All other components within normal limits  CG4 I-STAT (LACTIC ACID) - Abnormal; Notable for the following:    Lactic Acid, Venous 2.31 (*)    All other components within normal limits  POCT I-STAT, CHEM 8 - Abnormal; Notable for the following:    Glucose, Bld 334 (*)    All other components within normal limits  CULTURE, BLOOD (ROUTINE X 2)  CULTURE, BLOOD (ROUTINE X 2)  URINE CULTURE  WOUND CULTURE  WOUND CULTURE  URINE MICROSCOPIC-ADD ON   Imaging Review No results found.  EKG Interpretation   None      INCISION AND DRAINAGE Performed by: Antony Madura Consent: Verbal consent obtained. Risks and benefits: risks, benefits and alternatives were discussed Type: abscess  Body area: R medial upper chest  Anesthesia: local infiltration  Incision was made with a scalpel.  Local anesthetic: lidocaine 1% with epinephrine  Anesthetic total: 2 ml  Complexity: complex Blunt dissection to break up loculations  Drainage: purulent and bloody  Drainage amount: moderate  Packing material: none  Patient tolerance: Patient tolerated the procedure well with no immediate complications.   INCISION AND DRAINAGE Performed by: Antony Madura Consent: Verbal consent obtained. Risks and benefits: risks, benefits and alternatives were discussed Type: abscess  Body area: R lateral upper chest  Anesthesia: local infiltration  Incision was made with a scalpel.  Local anesthetic: lidocaine 1% with epinephrine  Anesthetic total: 2 ml  Complexity: complex Blunt dissection to break up loculations  Drainage: purulent and bloody  Drainage amount: small  Packing material: none  Patient tolerance: Patient tolerated the procedure well with no immediate complications.   MDM   1. Abscess of chest wall   2. Hyperglycemia   3. Sepsis    Patient presents for rapidly progressing abscesses to his R upper chest. Patient diabetic and hyperglycemic on arrival  to 386. During time in waiting room patient developed a fever and tachycardia. IVF and sepsis work up ordered at this time as well as tylenol for fever control. Patient found to have leukocytosis of 12.6 and mildly elevated lactate of 2.31. Blood cultures pending and IV Vancomycin and Zosyn ordered per sepsis criteria.   Abscesses I&D'd at bedside. Patient tolerated well with no immediate complications and wound cultures sent. Given patient's hyperglycemia secondary to infection, rapidly progressing symptoms, and that he meets sepsis criteria. Will admit for observation and another dose of IV abx. Have consulted with Dr. Adela Glimpse of [facility] who will admit. Patient agreeable to plan. Vitals stabilizing.    Antony Madura, [place] 10/03/13 [phone]

## 2013-10-03 NOTE — Care Management Note (Addendum)
    Page 1 of 1   10/06/2013     11:33:47 AM   CARE MANAGEMENT NOTE 10/06/2013  Patient:  KEATH, MATERA   Account Number:  [id number]  Date Initiated:  10/03/2013  Documentation initiated by:  Lanier Clam  Subjective/Objective Assessment:   37 Y/O M ADMITTED W/CHEST ABSCESS.     Action/Plan:   FROM HOME.HAS PCP,PHARMACY.   Anticipated DC Date:  10/06/2013   Anticipated DC Plan:  HOME/SELF CARE      DC Planning Services  CM consult      Choice offered to / List presented to:             Status of service:  Completed, signed off Medicare Important Message given?   (If response is "NO", the following Medicare IM given date fields will be blank) Date Medicare IM given:   Date Additional Medicare IM given:    Discharge Disposition:  HOME/SELF CARE  Per UR Regulation:  Reviewed for med. necessity/level of care/duration of stay  If discussed at Long Length of Stay Meetings, dates discussed:    Comments:  10/06/13 Koji Niehoff RN,BSN NCM [phone] D/C HOME NO D/C NEEDS OR ORDERS.  10/03/13 Tyshan Enderle RN,BSN NCM [phone] IV ABX.AWAIT CX.

## 2013-10-04 DIAGNOSIS — L02219 Cutaneous abscess of trunk, unspecified: Secondary | ICD-10-CM

## 2013-10-04 DIAGNOSIS — L03319 Cellulitis of trunk, unspecified: Secondary | ICD-10-CM

## 2013-10-04 LAB — COMPREHENSIVE METABOLIC PANEL
AST: 16 U/L (ref 0–37)
Alkaline Phosphatase: 124 U/L — ABNORMAL HIGH (ref 39–117)
BUN: 10 mg/dL (ref 6–23)
CO2: 23 mEq/L (ref 19–32)
Chloride: 100 mEq/L (ref 96–112)
GFR calc Af Amer: >90 mL/min (ref >90)
GFR calc non Af Amer: >90 mL/min (ref >90)
Glucose, Bld: 242 mg/dL — ABNORMAL HIGH (ref 70–99)
Potassium: 3.8 mEq/L (ref 3.5–5.1)
Total Bilirubin: 0.4 mg/dL (ref 0.3–1.2)
Total Protein: 6.6 g/dL (ref 6.0–8.3)

## 2013-10-04 LAB — GLUCOSE, CAPILLARY
Glucose-Capillary: 244 mg/dL — ABNORMAL HIGH (ref 70–99)
Glucose-Capillary: 158 mg/dL — ABNORMAL HIGH (ref 70–99)
Glucose-Capillary: 211 mg/dL — ABNORMAL HIGH (ref 70–99)

## 2013-10-04 LAB — TSH: TSH: 0.76 u[IU]/mL (ref 0.350–4.500)

## 2013-10-04 LAB — URINE CULTURE
Colony Count: NO GROWTH
Culture: NO GROWTH

## 2013-10-04 LAB — CBC
Hemoglobin: 12 g/dL — ABNORMAL LOW (ref 13.0–17.0)
Platelets: 186 10*3/uL (ref 150–400)
RDW: 11.8 % (ref 11.5–15.5)
WBC: 9.4 10*3/uL (ref 4.0–10.5)

## 2013-10-04 LAB — MAGNESIUM: Magnesium: 2 mg/dL (ref 1.5–2.5)

## 2013-10-04 MED ORDER — METFORMIN HCL 500 MG PO TABS
500.0000 mg | ORAL_TABLET | Freq: Two times a day (BID) | ORAL | Status: DC
  Administered 2013-10-04 – 2013-10-06 (×4): 500 mg via ORAL
  Filled 2013-10-04 (×6): qty 1

## 2013-10-04 NOTE — Progress Notes (Signed)
[facility] PROGRESS NOTE      Luke Abbott NWG:[id number] DOB: 05-11-1976 DOA: 10/02/2013 PCP: [name], MD  Assessment/Plan: Skin abscess - He is currently being on vancomycin and Zosyn. Afebrile and normotensive this morning. White count has improved. I do appreciate a 5 x 5 cm induration under one of the abscesses that was incised yesterday. I've kindly asked surgery to evaluate the patient and see whether there is any additional need for intervention. His microbiology shows moderate Staphylococcus aureus, the report is pending still. He can be narrowed to oral antibiotics and discharged home once microbiology is available and he is clinically stable Diabetes mellitus - Poorly controlled. Patient is taking metformin only 500 twice a day because of GI side effects. Have started long-acting insulin in the hospital, he'll probably need to continue that after discharge.  Diet: Carb modified Fluids: None DVT Prophylaxis: Lovenox  Code Status: Full code Family Communication: None  Disposition Plan: Home when medically ready  Consultants:  Surgery  Procedures:  I&D in the ER   Antibiotics  Anti-infectives   Start     Dose/Rate Route Frequency Ordered Stop   10/03/13 1000  vancomycin (VANCOCIN) IVPB 1000 mg/200 mL premix     1,000 mg 200 mL/hr over 60 Minutes Intravenous Every 12 hours 10/03/13 0255     10/03/13 0800  piperacillin-tazobactam (ZOSYN) IVPB 3.375 g     3.375 g 12.5 mL/hr over 240 Minutes Intravenous 3 times per day 10/03/13 0255     10/03/13 0300  piperacillin-tazobactam (ZOSYN) IVPB 3.375 g     3.375 g 100 mL/hr over 30 Minutes Intravenous  Once 10/03/13 0246 10/03/13 0329   10/03/13 0300  vancomycin (VANCOCIN) IVPB 1000 mg/200 mL premix     1,000 mg 200 mL/hr over 60 Minutes Intravenous  Once 10/03/13 0246 10/03/13 0442     Antibiotics Given (last 72 hours)   Date/Time Action Medication Dose Rate   10/03/13 1024 Given    piperacillin-tazobactam (ZOSYN) IVPB 3.375 g 3.375 g 12.5 mL/hr   10/03/13 1136 Given   vancomycin (VANCOCIN) IVPB 1000 mg/200 mL premix 1,000 mg 200 mL/hr   10/03/13 1350 Given   piperacillin-tazobactam (ZOSYN) IVPB 3.375 g 3.375 g 12.5 mL/hr   10/03/13 2220 Given   piperacillin-tazobactam (ZOSYN) IVPB 3.375 g 3.375 g 12.5 mL/hr   10/03/13 2221 Given   vancomycin (VANCOCIN) IVPB 1000 mg/200 mL premix 1,000 mg 200 mL/hr   10/04/13 0600 Given   piperacillin-tazobactam (ZOSYN) IVPB 3.375 g 3.375 g 12.5 mL/hr     HPI/Subjective: Feeling better this morning. Still with chest tenderness around the abscess.  Objective: Filed Vitals:   10/03/13 1309 10/03/13 2036 10/04/13 0215 10/04/13 0613  BP: 116/72 116/70 117/70 127/76  Pulse: 88 93 89 91  Temp: 98.7 F (37.1 C) 99.1 F (37.3 C) 99.3 F (37.4 C) 99.1 F (37.3 C)  TempSrc: Oral Oral Oral Oral  Resp: 18 18 18 18   Height:      Weight:      SpO2: 100% 100% 98% 97%    Intake/Output Summary (Last 24 hours) at 10/04/13 0741 Last data filed at 10/03/13 1800  Gross per 24 hour  Intake   1910 ml  Output      0 ml  Net   1910 ml   Filed Weights   10/02/13 2319 10/03/13 0628  Weight: 104.327 kg (230 lb) 92.9 kg (204 lb 12.9 oz)    Exam:   General:  NAD  Cardiovascular: regular rate and  rhythm, without MRG  Respiratory: good air movement, clear to auscultation throughout, no wheezing, ronchi or rales  Abdomen: soft, not tender to palpation, positive bowel sounds  MSK: no peripheral edema; 2 abscesses status post drainage on his anterior chest, one area with 5x5 cm induration  Neuro: Nonfocal  Data Reviewed: Basic Metabolic Panel:  Recent Labs Lab 10/03/13 0210 10/03/13 0227 10/04/13 0412  NA  --  137 132*  K  --  3.8 3.8  CL  --  99 100  CO2  --   --  23  GLUCOSE  --  334* 242*  BUN  --  13 10  CREATININE 0.86 1.10 0.80  CALCIUM  --   --  8.4  MG  --   --  2.0  PHOS  --   --  3.2   Liver Function  Tests:  Recent Labs Lab 10/04/13 0412  AST 16  ALT 19  ALKPHOS 124*  BILITOT 0.4  PROT 6.6  ALBUMIN 2.8*   CBC:  Recent Labs Lab 10/03/13 0210 10/03/13 0227 10/04/13 0412  WBC 12.6*  --  9.4  NEUTROABS 9.5*  --   --   HGB 14.8 17.0 12.0*  HCT 43.6 50.0 36.2*  MCV 83.2  --  84.0  PLT 218  --  186   CBG:  Recent Labs Lab 10/03/13 0728 10/03/13 1126 10/03/13 1612 10/03/13 2132 10/04/13 0708  GLUCAP 257* 235* 153* 245* 207*    Recent Results (from the past 240 hour(s))  CULTURE, ROUTINE-ABSCESS     Status: None   Collection Time    10/03/13  4:28 AM      Result Value Range Status   Specimen Description WOUND SMALL ABSCESS   Final   Special Requests NONE   Final   Gram Stain     Final   Value: MODERATE WBC PRESENT, PREDOMINANTLY PMN     NO SQUAMOUS EPITHELIAL CELLS SEEN     FEW GRAM POSITIVE COCCI IN PAIRS     IN CLUSTERS     Performed at [facility]   Culture PENDING   Incomplete   Report Status PENDING   Incomplete  CULTURE, ROUTINE-ABSCESS     Status: None   Collection Time    10/03/13  4:28 AM      Result Value Range Status   Specimen Description WOUND LARGE ABSCESS   Final   Special Requests NONE   Final   Gram Stain     Final   Value: MODERATE WBC PRESENT, PREDOMINANTLY PMN     NO SQUAMOUS EPITHELIAL CELLS SEEN     MODERATE GRAM POSITIVE COCCI IN PAIRS     IN CLUSTERS     Performed at [facility]   Culture PENDING   Incomplete   Report Status PENDING   Incomplete     Studies: No results found.  Scheduled Meds: . docusate sodium  100 mg Oral BID  . enoxaparin (LOVENOX) injection  40 mg Subcutaneous Q24H  . glipiZIDE  5 mg Oral QAC breakfast  . insulin aspart  0-15 Units Subcutaneous TID WC  . insulin aspart  0-5 Units Subcutaneous QHS  . insulin glargine  12 Units Subcutaneous QHS  . piperacillin-tazobactam (ZOSYN)  IV  3.375 g Intravenous Q8H  . sodium chloride  3 mL Intravenous Q12H  . vancomycin  1,000 mg  Intravenous Q12H   Continuous Infusions: . sodium chloride 1,000 mL (10/03/13 0713)    Active Problems:   Diabetes mellitus  Skin abscess   SIRS (systemic inflammatory response syndrome)  Time spent: 25  Pamella Pert, MD [facility] Pager [phone]. If 7 PM - 7 AM, please contact night-coverage at [email or web address], password [facility] 10/04/2013, 7:41 AM  LOS: 2 days

## 2013-10-04 NOTE — Consult Note (Signed)
Reason for Consult: Skin Abscess  Referring Physician: Leatha Gilding, MD  Luke Abbott is an 37 y.o. male.  HPI: Two areas of swelling on the chest for the past 1 week. He was not feeling well and had severe pain. Presented to ER and was found to be febrile up to 101.9. With two abscesses on his chest that were drained.The 1st site started about Thanksgiving day 11/27, the 2nd site started 2 days ago.  Both sites I&D in the ER.  The first site looks good the 2nd site with picture below, still has erythema, with drainage.  We are ask to see and decide if he needs further drainage of the 2nd site.  Labs show Gram Positive cocci, he is on good antibiotic coverage.  Past Medical History  Diagnosis Date  . Diabetes mellitus     History reviewed. No pertinent past surgical history.  Family History  Problem Relation Age of Onset  . Hypertension Father     Social History:  reports that he has never smoked. He has never used smokeless tobacco. He reports that he drinks alcohol. He reports that he does not use illicit drugs.  Allergies: No Known Allergies  Medications:  Prior to Admission:  Prescriptions prior to admission  Medication Sig Dispense Refill  . glipiZIDE (GLUCOTROL) 5 MG tablet Take 5 mg by mouth daily before breakfast.      . metFORMIN (GLUCOPHAGE) 1000 MG tablet Take 1,000 mg by mouth 2 (two) times daily with a meal.       Scheduled: . docusate sodium  100 mg Oral BID  . enoxaparin (LOVENOX) injection  40 mg Subcutaneous Q24H  . glipiZIDE  5 mg Oral QAC breakfast  . insulin aspart  0-15 Units Subcutaneous TID WC  . insulin aspart  0-5 Units Subcutaneous QHS  . insulin glargine  12 Units Subcutaneous QHS  . piperacillin-tazobactam (ZOSYN)  IV  3.375 g Intravenous Q8H  . sodium chloride  3 mL Intravenous Q12H  . vancomycin  1,000 mg Intravenous Q12H   Continuous: . sodium chloride 1,000 mL (10/03/13 0713)   AVW:[id number], acetaminophen,  HYDROcodone-acetaminophen, ondansetron (ZOFRAN) IV, ondansetron Anti-infectives   Start     Dose/Rate Route Frequency Ordered Stop   10/03/13 1000  vancomycin (VANCOCIN) IVPB 1000 mg/200 mL premix     1,000 mg 200 mL/hr over 60 Minutes Intravenous Every 12 hours 10/03/13 0255     10/03/13 0800  piperacillin-tazobactam (ZOSYN) IVPB 3.375 g     3.375 g 12.5 mL/hr over 240 Minutes Intravenous 3 times per day 10/03/13 0255     10/03/13 0300  piperacillin-tazobactam (ZOSYN) IVPB 3.375 g     3.375 g 100 mL/hr over 30 Minutes Intravenous  Once 10/03/13 0246 10/03/13 0329   10/03/13 0300  vancomycin (VANCOCIN) IVPB 1000 mg/200 mL premix     1,000 mg 200 mL/hr over 60 Minutes Intravenous  Once 10/03/13 0246 10/03/13 0442      Results for orders placed during the hospital encounter of 10/02/13 (from the past 48 hour(s))  GLUCOSE, CAPILLARY     Status: Abnormal   Collection Time    10/02/13 11:24 PM      Result Value Range   Glucose-Capillary 386 (*) 70 - 99 mg/dL   Comment 1 Notify RN     Comment 2 Confirm Test in Lab    CBC WITH DIFFERENTIAL     Status: Abnormal   Collection Time    10/03/13  2:10 AM  Result Value Range   WBC 12.6 (*) 4.0 - 10.5 K/uL   RBC 5.24  4.22 - 5.81 MIL/uL   Hemoglobin 14.8  13.0 - 17.0 g/dL   HCT 16.1  09.6 - 04.5 %   MCV 83.2  78.0 - 100.0 fL   MCH 28.2  26.0 - 34.0 pg   MCHC 33.9  30.0 - 36.0 g/dL   RDW 40.9  81.1 - 91.4 %   Platelets 218  150 - 400 K/uL   Neutrophils Relative % 76  43 - 77 %   Neutro Abs 9.5 (*) 1.7 - 7.7 K/uL   Lymphocytes Relative 16  12 - 46 %   Lymphs Abs 2.0  0.7 - 4.0 K/uL   Monocytes Relative 8  3 - 12 %   Monocytes Absolute 1.0  0.1 - 1.0 K/uL   Eosinophils Relative 1  0 - 5 %   Eosinophils Absolute 0.1  0.0 - 0.7 K/uL   Basophils Relative 0  0 - 1 %   Basophils Absolute 0.0  0.0 - 0.1 K/uL  CREATININE, SERUM     Status: None   Collection Time    10/03/13  2:10 AM      Result Value Range   Creatinine, Ser 0.86  0.50  - 1.35 mg/dL   GFR calc non Af Amer >90  >90 mL/min   GFR calc Af Amer >90  >90 mL/min   Comment: (NOTE)     The eGFR has been calculated using the CKD EPI equation.     This calculation has not been validated in all clinical situations.     eGFR's persistently <90 mL/min signify possible Chronic Kidney     Disease.  HEMOGLOBIN A1C     Status: Abnormal   Collection Time    10/03/13  2:10 AM      Result Value Range   Hemoglobin A1C 11.5 (*) <5.7 %   Comment: (NOTE)                                                                               According to the ADA Clinical Practice Recommendations for 2011, when     HbA1c is used as a screening test:      >=6.5%   Diagnostic of Diabetes Mellitus               (if abnormal result is confirmed)     5.7-6.4%   Increased risk of developing Diabetes Mellitus     References:Diagnosis and Classification of Diabetes Mellitus,Diabetes     Care,2011,34(Suppl 1):S62-S69 and Standards of Medical Care in             Diabetes - 2011,Diabetes Care,2011,34 (Suppl 1):S11-S61.   Mean Plasma Glucose 283 (*) <117 mg/dL   Comment: Performed at [facility]  CG4 I-STAT (LACTIC ACID)     Status: Abnormal   Collection Time    10/03/13  2:27 AM      Result Value Range   Lactic Acid, Venous 2.31 (*) 0.5 - 2.2 mmol/L  POCT I-STAT, CHEM 8     Status: Abnormal   Collection Time    10/03/13  2:27 AM  Result Value Range   Sodium 137  135 - 145 mEq/L   Potassium 3.8  3.5 - 5.1 mEq/L   Chloride 99  96 - 112 mEq/L   BUN 13  6 - 23 mg/dL   Creatinine, Ser 1.61  0.50 - 1.35 mg/dL   Glucose, Bld 096 (*) 70 - 99 mg/dL   Calcium, Ion 0.45  1.12 - 1.23 mmol/L   TCO2 24  0 - 100 mmol/L   Hemoglobin 17.0  13.0 - 17.0 g/dL   HCT 40.9  81.1 - 91.4 %  CULTURE, BLOOD (ROUTINE X 2)     Status: None   Collection Time    10/03/13  2:30 AM      Result Value Range   Specimen Description BLOOD LEFT HAND     Special Requests BOTTLES DRAWN AEROBIC AND ANAEROBIC  4CC     Culture  Setup Time       Value: 10/03/2013 09:02     Performed at [facility]   Culture       Value:        BLOOD CULTURE RECEIVED NO GROWTH TO DATE CULTURE WILL BE HELD FOR 5 DAYS BEFORE ISSUING A FINAL NEGATIVE REPORT     Performed at [facility]   Report Status PENDING    CULTURE, BLOOD (ROUTINE X 2)     Status: None   Collection Time    10/03/13  2:35 AM      Result Value Range   Specimen Description BLOOD LEFT ANTECUBITAL     Special Requests BOTTLES DRAWN AEROBIC AND ANAEROBIC 5CC     Culture  Setup Time       Value: 10/03/2013 09:02     Performed at [facility]   Culture       Value:        BLOOD CULTURE RECEIVED NO GROWTH TO DATE CULTURE WILL BE HELD FOR 5 DAYS BEFORE ISSUING A FINAL NEGATIVE REPORT     Performed at [facility]   Report Status PENDING    URINALYSIS, ROUTINE W REFLEX MICROSCOPIC     Status: Abnormal   Collection Time    10/03/13  2:51 AM      Result Value Range   Color, Urine YELLOW  YELLOW   APPearance CLEAR  CLEAR   Specific Gravity, Urine >1.046 (*) 1.005 - 1.030   pH 5.5  5.0 - 8.0   Glucose, UA >1000 (*) NEGATIVE mg/dL   Hgb urine dipstick NEGATIVE  NEGATIVE   Bilirubin Urine NEGATIVE  NEGATIVE   Ketones, ur 15 (*) NEGATIVE mg/dL   Protein, ur NEGATIVE  NEGATIVE mg/dL   Urobilinogen, UA 1.0  0.0 - 1.0 mg/dL   Nitrite NEGATIVE  NEGATIVE   Leukocytes, UA NEGATIVE  NEGATIVE  URINE CULTURE     Status: None   Collection Time    10/03/13  2:51 AM      Result Value Range   Specimen Description URINE, CLEAN CATCH     Special Requests NONE     Culture  Setup Time       Value: 10/03/2013 10:55     Performed at [facility] Count       Value: NO GROWTH     Performed at [facility]   Culture       Value: NO GROWTH     Performed at [facility]   Report Status 10/04/2013 FINAL    URINE  MICROSCOPIC-ADD ON     Status: None   Collection Time    10/03/13  2:51 AM       Result Value Range   WBC, UA 0-2  <3 WBC/hpf   RBC / HPF 0-2  <3 RBC/hpf  CULTURE, ROUTINE-ABSCESS     Status: None   Collection Time    10/03/13  4:28 AM      Result Value Range   Specimen Description WOUND SMALL ABSCESS     Special Requests NONE     Gram Stain       Value: MODERATE WBC PRESENT, PREDOMINANTLY PMN     NO SQUAMOUS EPITHELIAL CELLS SEEN     FEW GRAM POSITIVE COCCI IN PAIRS     IN CLUSTERS     Performed at [facility]   Culture PENDING     Report Status PENDING    CULTURE, ROUTINE-ABSCESS     Status: None   Collection Time    10/03/13  4:28 AM      Result Value Range   Specimen Description WOUND LARGE ABSCESS     Special Requests NONE     Gram Stain       Value: MODERATE WBC PRESENT, PREDOMINANTLY PMN     NO SQUAMOUS EPITHELIAL CELLS SEEN     MODERATE GRAM POSITIVE COCCI IN PAIRS     IN CLUSTERS     Performed at [facility]   Culture PENDING     Report Status PENDING    GLUCOSE, CAPILLARY     Status: Abnormal   Collection Time    10/03/13  6:46 AM      Result Value Range   Glucose-Capillary 254 (*) 70 - 99 mg/dL   Comment 1 Documented in Chart     Comment 2 Notify RN    GLUCOSE, CAPILLARY     Status: Abnormal   Collection Time    10/03/13  7:28 AM      Result Value Range   Glucose-Capillary 257 (*) 70 - 99 mg/dL  GLUCOSE, CAPILLARY     Status: Abnormal   Collection Time    10/03/13 11:26 AM      Result Value Range   Glucose-Capillary 235 (*) 70 - 99 mg/dL  LACTIC ACID, PLASMA     Status: None   Collection Time    10/03/13 11:33 AM      Result Value Range   Lactic Acid, Venous 1.0  0.5 - 2.2 mmol/L  GLUCOSE, CAPILLARY     Status: Abnormal   Collection Time    10/03/13  4:12 PM      Result Value Range   Glucose-Capillary 153 (*) 70 - 99 mg/dL  GLUCOSE, CAPILLARY     Status: Abnormal   Collection Time    10/03/13  9:32 PM      Result Value Range   Glucose-Capillary 245 (*) 70 - 99 mg/dL  MAGNESIUM     Status: None    Collection Time    10/04/13  4:12 AM      Result Value Range   Magnesium 2.0  1.5 - 2.5 mg/dL  PHOSPHORUS     Status: None   Collection Time    10/04/13  4:12 AM      Result Value Range   Phosphorus 3.2  2.3 - 4.6 mg/dL  COMPREHENSIVE METABOLIC PANEL     Status: Abnormal   Collection Time    10/04/13  4:12 AM      Result Value  Range   Sodium 132 (*) 135 - 145 mEq/L   Potassium 3.8  3.5 - 5.1 mEq/L   Chloride 100  96 - 112 mEq/L   CO2 23  19 - 32 mEq/L   Glucose, Bld 242 (*) 70 - 99 mg/dL   BUN 10  6 - 23 mg/dL   Creatinine, Ser 1.61  0.50 - 1.35 mg/dL   Calcium 8.4  8.4 - 09.6 mg/dL   Total Protein 6.6  6.0 - 8.3 g/dL   Albumin 2.8 (*) 3.5 - 5.2 g/dL   AST 16  0 - 37 U/L   ALT 19  0 - 53 U/L   Alkaline Phosphatase 124 (*) 39 - 117 U/L   Total Bilirubin 0.4  0.3 - 1.2 mg/dL   GFR calc non Af Amer >90  >90 mL/min   GFR calc Af Amer >90  >90 mL/min   Comment: (NOTE)     The eGFR has been calculated using the CKD EPI equation.     This calculation has not been validated in all clinical situations.     eGFR's persistently <90 mL/min signify possible Chronic Kidney     Disease.  CBC     Status: Abnormal   Collection Time    10/04/13  4:12 AM      Result Value Range   WBC 9.4  4.0 - 10.5 K/uL   RBC 4.31  4.22 - 5.81 MIL/uL   Hemoglobin 12.0 (*) 13.0 - 17.0 g/dL   Comment: DELTA CHECK NOTED     REPEATED TO VERIFY     PREVIOUS RESULT ISTAT   HCT 36.2 (*) 39.0 - 52.0 %   MCV 84.0  78.0 - 100.0 fL   MCH 27.8  26.0 - 34.0 pg   MCHC 33.1  30.0 - 36.0 g/dL   RDW 04.5  40.9 - 81.1 %   Platelets 186  150 - 400 K/uL  GLUCOSE, CAPILLARY     Status: Abnormal   Collection Time    10/04/13  7:08 AM      Result Value Range   Glucose-Capillary 207 (*) 70 - 99 mg/dL    No results found.  Review of Systems  All other systems reviewed and are negative.   Blood pressure 127/76, pulse 91, temperature 99.1 F (37.3 C), temperature source Oral, resp. rate 18, height 6' (1.829 m),  weight 92.9 kg (204 lb 12.9 oz), SpO2 97.00%. Physical Exam  Constitutional: He is oriented to person, place, and time. He appears well-developed and well-nourished.  HENT:  Head: Normocephalic and atraumatic.  Nose: Nose normal.  Eyes: Conjunctivae and EOM are normal. Pupils are equal, round, and reactive to light. Right eye exhibits no discharge. Left eye exhibits no discharge. No scleral icterus.  Neck: Normal range of motion. Neck supple. No JVD present. No tracheal deviation present. No thyromegaly present.  Cardiovascular: Normal rate, regular rhythm, normal heart sounds and intact distal pulses.  Exam reveals no gallop.   No murmur heard. Respiratory: Effort normal and breath sounds normal. No respiratory distress. He has no wheezes. He has no rales. He exhibits no tenderness.  He has 2 open sites the 1st is below the right clavicle.  It is open and no drainage, erythema, or swelling.  2nd is to the right of the sternum, mid sternum level.  It is 4.5 cm wide, with erythema and some swelling.  It has been opened mid portion with .6cm opening that is draining purulent material.  It is  not fluctuant but it does have some drainage that can be expressed. See picture:  GI: Soft. Bowel sounds are normal. He exhibits no distension and no mass. There is no tenderness. There is no rebound and no guarding.  Musculoskeletal: He exhibits no edema and no tenderness.  Lymphadenopathy:    He has no cervical adenopathy.  Neurological: He is alert and oriented to person, place, and time. No cranial nerve deficit.  Skin: Skin is warm and dry.  See note above  Psychiatric: He has a normal mood and affect. His behavior is normal. Judgment and thought content normal.      Assessment/Plan: 1. Skin abscess x 2 right chest 2.  AODM 3.  Hypertension  Plan:  I thin it is draining well, with good antibiotic coverage.  Add warm packs to the above site and watch for now.  I do not feel any fluctuance that i  think a larger I&D will improve.  Valdemar Mcclenahan 10/04/2013, 9:52 AM

## 2013-10-05 ENCOUNTER — Encounter (HOSPITAL_COMMUNITY)
Admission: EM | Disposition: A | Discharge status: Home / Self Care | Payer: Self-pay | Source: Home / Self Care | Attending: Internal Medicine

## 2013-10-05 ENCOUNTER — Inpatient Hospital Stay (HOSPITAL_COMMUNITY): Payer: Managed Care, Other (non HMO) | Admitting: Anesthesiology

## 2013-10-05 ENCOUNTER — Encounter (HOSPITAL_COMMUNITY): Payer: Self-pay

## 2013-10-05 ENCOUNTER — Encounter (HOSPITAL_COMMUNITY): Payer: Managed Care, Other (non HMO) | Admitting: Anesthesiology

## 2013-10-05 HISTORY — PX: IRRIGATION AND DEBRIDEMENT ABSCESS: SHX5252

## 2013-10-05 LAB — CULTURE, ROUTINE-ABSCESS

## 2013-10-05 LAB — CBC
HCT: 37.6 % — ABNORMAL LOW (ref 39.0–52.0)
Hemoglobin: 12.3 g/dL — ABNORMAL LOW (ref 13.0–17.0)
MCH: 27.6 pg (ref 26.0–34.0)
Platelets: 215 10*3/uL (ref 150–400)
RBC: 4.45 MIL/uL (ref 4.22–5.81)

## 2013-10-05 LAB — GLUCOSE, CAPILLARY
Glucose-Capillary: 110 mg/dL — ABNORMAL HIGH (ref 70–99)
Glucose-Capillary: 254 mg/dL — ABNORMAL HIGH (ref 70–99)

## 2013-10-05 LAB — CREATININE, SERUM
Creatinine, Ser: 0.69 mg/dL (ref 0.50–1.35)
GFR calc Af Amer: >90 mL/min (ref >90)

## 2013-10-05 LAB — VANCOMYCIN, TROUGH: Vancomycin Tr: <5 ug/mL — ABNORMAL LOW (ref 10.0–20.0)

## 2013-10-05 SURGERY — IRRIGATION AND DEBRIDEMENT ABSCESS
Anesthesia: General | Site: Chest | Laterality: Right

## 2013-10-05 MED ORDER — PROPOFOL 10 MG/ML IV BOLUS
INTRAVENOUS | Status: DC | PRN
  Administered 2013-10-05: 200 mg via INTRAVENOUS

## 2013-10-05 MED ORDER — FENTANYL CITRATE 0.05 MG/ML IJ SOLN
INTRAMUSCULAR | Status: DC | PRN
  Administered 2013-10-05: 50 ug via INTRAVENOUS
  Administered 2013-10-05: 100 ug via INTRAVENOUS

## 2013-10-05 MED ORDER — MIDAZOLAM HCL 2 MG/2ML IJ SOLN
INTRAMUSCULAR | Status: AC
  Filled 2013-10-05: qty 2

## 2013-10-05 MED ORDER — PROMETHAZINE HCL 25 MG/ML IJ SOLN: 6.2500 mg | INTRAMUSCULAR | Status: DC | PRN

## 2013-10-05 MED ORDER — LIDOCAINE HCL (CARDIAC) 20 MG/ML IV SOLN
INTRAVENOUS | Status: AC
  Filled 2013-10-05: qty 5

## 2013-10-05 MED ORDER — ONDANSETRON HCL 4 MG/2ML IJ SOLN
INTRAMUSCULAR | Status: DC | PRN
  Administered 2013-10-05: 4 mg via INTRAVENOUS

## 2013-10-05 MED ORDER — FENTANYL CITRATE 0.05 MG/ML IJ SOLN
INTRAMUSCULAR | Status: AC
  Filled 2013-10-05: qty 5

## 2013-10-05 MED ORDER — LIDOCAINE HCL (PF) 2 % IJ SOLN
INTRAMUSCULAR | Status: DC | PRN
  Administered 2013-10-05: 75 mg via INTRADERMAL

## 2013-10-05 MED ORDER — MIDAZOLAM HCL 5 MG/5ML IJ SOLN
INTRAMUSCULAR | Status: DC | PRN
  Administered 2013-10-05: 2 mg via INTRAVENOUS

## 2013-10-05 MED ORDER — ONDANSETRON HCL 4 MG/2ML IJ SOLN
INTRAMUSCULAR | Status: AC
  Filled 2013-10-05: qty 2

## 2013-10-05 MED ORDER — OXYCODONE HCL 5 MG PO TABS: 5.0000 mg | ORAL_TABLET | Freq: Once | ORAL | Status: DC | PRN

## 2013-10-05 MED ORDER — MUPIROCIN 2 % EX OINT
1.0000 "application " | TOPICAL_OINTMENT | Freq: Two times a day (BID) | CUTANEOUS | Status: DC
  Administered 2013-10-06: 1 via NASAL
  Filled 2013-10-05: qty 22

## 2013-10-05 MED ORDER — OXYCODONE HCL 5 MG/5ML PO SOLN
5.0000 mg | Freq: Once | ORAL | Status: DC | PRN
  Filled 2013-10-05: qty 5

## 2013-10-05 MED ORDER — CHLORHEXIDINE GLUCONATE CLOTH 2 % EX PADS
6.0000 | MEDICATED_PAD | Freq: Every day | CUTANEOUS | Status: DC
  Administered 2013-10-06: 6 via TOPICAL

## 2013-10-05 MED ORDER — PROPOFOL 10 MG/ML IV BOLUS
INTRAVENOUS | Status: AC
  Filled 2013-10-05: qty 20

## 2013-10-05 MED ORDER — MEPERIDINE HCL 50 MG/ML IJ SOLN: 6.2500 mg | INTRAMUSCULAR | Status: DC | PRN

## 2013-10-05 MED ORDER — HEPARIN SODIUM (PORCINE) 5000 UNIT/ML IJ SOLN
5000.0000 [IU] | Freq: Three times a day (TID) | INTRAMUSCULAR | Status: DC
  Administered 2013-10-06: 06:00:00 5000 [IU] via SUBCUTANEOUS
  Filled 2013-10-05 (×4): qty 1

## 2013-10-05 MED ORDER — LACTATED RINGERS IV SOLN: INTRAVENOUS | Status: DC

## 2013-10-05 MED ORDER — HYDROMORPHONE HCL PF 1 MG/ML IJ SOLN
INTRAMUSCULAR | Status: AC
  Filled 2013-10-05: qty 1

## 2013-10-05 MED ORDER — MORPHINE SULFATE 10 MG/ML IJ SOLN: 1.0000 mg | INTRAMUSCULAR | Status: DC | PRN

## 2013-10-05 MED ORDER — HYDROMORPHONE HCL PF 1 MG/ML IJ SOLN
0.2500 mg | INTRAMUSCULAR | Status: DC | PRN
  Administered 2013-10-05 (×2): 0.5 mg via INTRAVENOUS

## 2013-10-05 SURGICAL SUPPLY — 32 items
BENZOIN TINCTURE PRP APPL 2/3 (GAUZE/BANDAGES/DRESSINGS) ×2 IMPLANT
BLADE HEX COATED 2.75 (ELECTRODE) ×2 IMPLANT
BLADE SURG 15 STRL LF DISP TIS (BLADE) ×1 IMPLANT
BLADE SURG 15 STRL SS (BLADE) ×1
BLADE SURG SZ10 CARB STEEL (BLADE) ×2 IMPLANT
CANISTER SUCTION 2500CC (MISCELLANEOUS) ×2 IMPLANT
DECANTER SPIKE VIAL GLASS SM (MISCELLANEOUS) ×2 IMPLANT
DRAPE LAPAROTOMY TRNSV 102X78 (DRAPE) ×2 IMPLANT
ELECT REM PT RETURN 9FT ADLT (ELECTROSURGICAL) ×2
ELECTRODE REM PT RTRN 9FT ADLT (ELECTROSURGICAL) ×1 IMPLANT
GAUZE PACKING IODOFORM 1/2 (PACKING) ×2 IMPLANT
GAUZE SPONGE 4X4 16PLY XRAY LF (GAUZE/BANDAGES/DRESSINGS) ×2 IMPLANT
GLOVE BIOGEL M 8.0 STRL (GLOVE) ×2 IMPLANT
GLOVE BIOGEL PI IND STRL 7.0 (GLOVE) ×1 IMPLANT
GLOVE BIOGEL PI INDICATOR 7.0 (GLOVE) ×1
GOWN PREVENTION PLUS LG XLONG (DISPOSABLE) ×2 IMPLANT
GOWN STRL REIN XL XLG (GOWN DISPOSABLE) ×4 IMPLANT
KIT BASIN OR (CUSTOM PROCEDURE TRAY) ×2 IMPLANT
MARKER SKIN DUAL TIP RULER LAB (MISCELLANEOUS) ×2 IMPLANT
NEEDLE HYPO 22GX1.5 SAFETY (NEEDLE) ×2 IMPLANT
NEEDLE HYPO 25X1 1.5 SAFETY (NEEDLE) ×2 IMPLANT
NS IRRIG 1000ML POUR BTL (IV SOLUTION) ×2 IMPLANT
PACK BASIC VI WITH GOWN DISP (CUSTOM PROCEDURE TRAY) ×2 IMPLANT
PAD ABD 8X10 STRL (GAUZE/BANDAGES/DRESSINGS) ×2 IMPLANT
PENCIL BUTTON HOLSTER BLD 10FT (ELECTRODE) ×2 IMPLANT
SOL PREP POV-IOD 16OZ 10% (MISCELLANEOUS) ×2 IMPLANT
SPONGE GAUZE 4X4 12PLY (GAUZE/BANDAGES/DRESSINGS) ×2 IMPLANT
STRIP CLOSURE SKIN 1/2X4 (GAUZE/BANDAGES/DRESSINGS) IMPLANT
SUT VIC AB 4-0 SH 18 (SUTURE) ×2 IMPLANT
SYR CONTROL 10ML LL (SYRINGE) ×2 IMPLANT
WATER STERILE IRR 1500ML POUR (IV SOLUTION) ×2 IMPLANT
YANKAUER SUCT BULB TIP 10FT TU (MISCELLANEOUS) IMPLANT

## 2013-10-05 NOTE — Anesthesia Postprocedure Evaluation (Signed)
Anesthesia Post Note  Patient: Luke Abbott  Procedure(s) Performed: Procedure(s) (LRB): IRRIGATION AND DEBRIDEMENT RIGHT CHEST WALL ABCESS (Right)  Anesthesia type: General  Patient location: PACU  Post pain: Pain level controlled  Post assessment: Post-op Vital signs reviewed  Last Vitals: BP 129/74  Pulse 88  Temp(Src) 36.8 C (Oral)  Resp 15  Ht 6' (1.829 m)  Wt 204 lb 12.9 oz (92.9 kg)  BMI 27.77 kg/m2  SpO2 97%  Post vital signs: Reviewed  Level of consciousness: sedated  Complications: No apparent anesthesia complications

## 2013-10-05 NOTE — Progress Notes (Signed)
  Subjective: More inflammation and drainage from the open site right of sternum.    Objective: Vital signs in last 24 hours: Temp:  [98.1 F (36.7 C)-99.2 F (37.3 C)] 98.1 F (36.7 C) (12/04 0549) Pulse Rate:  [73-102] 73 (12/04 0549) Resp:  [18] 18 (12/04 0549) BP: (115-131)/(65-79) 115/75 mmHg (12/04 0549) SpO2:  [99 %-100 %] 99 % (12/04 0549) Last BM Date: 10/02/13 Tm99.2 No labs Intake/Output from previous day: 12/03 0701 - 12/04 0700 In: 3718.8 [P.O.:600; I.V.:2843.8; IV Piggyback:275] Out: -  Intake/Output this shift: Total I/O In: 12.5 [IV Piggyback:12.5] Out: -   General appearance: alert, cooperative and no distress Skin: open site with more inflammation, and ongoing drainage.  Lab Results:   Recent Labs  10/03/13 0210 10/03/13 0227 10/04/13 0412  WBC 12.6*  --  9.4  HGB 14.8 17.0 12.0*  HCT 43.6 50.0 36.2*  PLT 218  --  186    BMET  Recent Labs  10/03/13 0227 10/04/13 0412  NA 137 132*  K 3.8 3.8  CL 99 100  CO2  --  23  GLUCOSE 334* 242*  BUN 13 10  CREATININE 1.10 0.80  CALCIUM  --  8.4   PT/INR No results found for this basename: LABPROT, INR,  in the last 72 hours   Recent Labs Lab 10/04/13 0412  AST 16  ALT 19  ALKPHOS 124*  BILITOT 0.4  PROT 6.6  ALBUMIN 2.8*     Lipase  No results found for this basename: lipase     Studies/Results: No results found.  Medications: . docusate sodium  100 mg Oral BID  . enoxaparin (LOVENOX) injection  40 mg Subcutaneous Q24H  . glipiZIDE  5 mg Oral QAC breakfast  . insulin aspart  0-15 Units Subcutaneous TID WC  . insulin aspart  0-5 Units Subcutaneous QHS  . insulin glargine  12 Units Subcutaneous QHS  . metFORMIN  500 mg Oral BID WC  . piperacillin-tazobactam (ZOSYN)  IV  3.375 g Intravenous Q8H  . sodium chloride  3 mL Intravenous Q12H  . vancomycin  1,000 mg Intravenous Q12H    Assessment/Plan 1. Skin abscess x 2 right chest  2. AODM  3. Hypertension   Plan:  NPO I  will ask Dr. Daphine Deutscher to see and Open it more later today.  LOS: 3 days    Luke Abbott 10/05/2013

## 2013-10-05 NOTE — Progress Notes (Signed)
CRITICAL VALUE ALERT  Critical value received:  + MRSA on two abscess cultures  Date of notification:  10/05/2013   Time of notification:  1130  Critical value read back:yes  Nurse who received alert:  Joaquin Music, RN  MD notified (1st page):  Gherghe  Time of first page:  1132  MD notified (2nd page):  Time of second page:  Responding MD:  Elvera Lennox  Time MD responded:  [phone]

## 2013-10-05 NOTE — Anesthesia Preprocedure Evaluation (Signed)
Anesthesia Evaluation  Patient identified by MRN, date of birth, ID band Patient awake    Reviewed: Allergy & Precautions, H&P , NPO status , Patient's Chart, lab work & pertinent test results  Airway Mallampati: II TM Distance: >3 FB Neck ROM: Full    Dental  (+) Dental Advisory Given   Pulmonary neg pulmonary ROS,  breath sounds clear to auscultation        Cardiovascular negative cardio ROS  Rhythm:Regular Rate:Normal     Neuro/Psych negative neurological ROS  negative psych ROS   GI/Hepatic negative GI ROS, Neg liver ROS,   Endo/Other  diabetes, Type 2, Oral Hypoglycemic Agents  Renal/GU negative Renal ROS     Musculoskeletal negative musculoskeletal ROS (+)   Abdominal   Peds  Hematology negative hematology ROS (+)   Anesthesia Other Findings   Reproductive/Obstetrics negative OB ROS                           Anesthesia Physical Anesthesia Plan  ASA: II  Anesthesia Plan: General   Post-op Pain Management:    Induction: Intravenous  Airway Management Planned: LMA  Additional Equipment:   Intra-op Plan:   Post-operative Plan: Extubation in OR  Informed Consent:   Dental advisory given  Plan Discussed with: CRNA  Anesthesia Plan Comments:         Anesthesia Quick Evaluation

## 2013-10-05 NOTE — Transfer of Care (Signed)
Immediate Anesthesia Transfer of Care Note  Patient: Luke Abbott  Procedure(s) Performed: Procedure(s): IRRIGATION AND DEBRIDEMENT RIGHT CHEST WALL ABCESS (Right)  Patient Location: PACU  Anesthesia Type:General  Level of Consciousness: awake, alert , oriented and patient cooperative  Airway & Oxygen Therapy: Patient Spontanous Breathing and Patient connected to face mask oxygen  Post-op Assessment: Report given to PACU RN, Post -op Vital signs reviewed and stable and Patient moving all extremities  Post vital signs: Reviewed and stable  Complications: No apparent anesthesia complications

## 2013-10-05 NOTE — Progress Notes (Signed)
[facility] PROGRESS NOTE      Luke Abbott NWG:[id number] DOB: Mar 04, 1976 DOA: 10/02/2013 PCP: [name], MD  Assessment/Plan: Skin abscess - surgery consulted yesterday, patient took to OR this afternoon for further I&D. Micro speciated MRSA, Zosyn discontinued. Will keep on Vanc for today and transition to Doxy/Bactrim prior to d/c in am  Diabetes mellitus - Poorly controlled. Patient is taking metformin only 500 twice a day because of GI side effects. Have started long-acting insulin in the hospital, he'll probably need to continue that after discharge. Suspect sugars will be better once infection is improving.   Diet: Carb modified Fluids: None DVT Prophylaxis: Lovenox  Code Status: Full code Family Communication: None  Disposition Plan: Home when medically ready  Consultants:  Surgery  Procedures:  I&D in the ER 12/2  I&D in the OR 12/4   Antibiotics  Anti-infectives   Start     Dose/Rate Route Frequency Ordered Stop   10/03/13 1000  vancomycin (VANCOCIN) IVPB 1000 mg/200 mL premix     1,000 mg 200 mL/hr over 60 Minutes Intravenous Every 12 hours 10/03/13 0255     10/03/13 0800  piperacillin-tazobactam (ZOSYN) IVPB 3.375 g  Status:  Discontinued     3.375 g 12.5 mL/hr over 240 Minutes Intravenous 3 times per day 10/03/13 0255 10/05/13 1245   10/03/13 0300  piperacillin-tazobactam (ZOSYN) IVPB 3.375 g     3.375 g 100 mL/hr over 30 Minutes Intravenous  Once 10/03/13 0246 10/03/13 0329   10/03/13 0300  vancomycin (VANCOCIN) IVPB 1000 mg/200 mL premix     1,000 mg 200 mL/hr over 60 Minutes Intravenous  Once 10/03/13 0246 10/03/13 0442     Antibiotics Given (last 72 hours)   Date/Time Action Medication Dose Rate   10/03/13 1024 Given   piperacillin-tazobactam (ZOSYN) IVPB 3.375 g 3.375 g 12.5 mL/hr   10/03/13 1136 Given   vancomycin (VANCOCIN) IVPB 1000 mg/200 mL premix 1,000 mg 200 mL/hr   10/03/13 1350 Given   piperacillin-tazobactam (ZOSYN) IVPB  3.375 g 3.375 g 12.5 mL/hr   10/03/13 2220 Given   piperacillin-tazobactam (ZOSYN) IVPB 3.375 g 3.375 g 12.5 mL/hr   10/03/13 2221 Given   vancomycin (VANCOCIN) IVPB 1000 mg/200 mL premix 1,000 mg 200 mL/hr   10/04/13 0600 Given   piperacillin-tazobactam (ZOSYN) IVPB 3.375 g 3.375 g 12.5 mL/hr   10/04/13 1008 Given   vancomycin (VANCOCIN) IVPB 1000 mg/200 mL premix 1,000 mg 200 mL/hr   10/04/13 1307 Given   piperacillin-tazobactam (ZOSYN) IVPB 3.375 g 3.375 g 12.5 mL/hr   10/04/13 2209 Given   piperacillin-tazobactam (ZOSYN) IVPB 3.375 g 3.375 g 12.5 mL/hr   10/04/13 2209 Given   vancomycin (VANCOCIN) IVPB 1000 mg/200 mL premix 1,000 mg 200 mL/hr   10/05/13 0553 Given   piperacillin-tazobactam (ZOSYN) IVPB 3.375 g 3.375 g 12.5 mL/hr   10/05/13 1018 Given   vancomycin (VANCOCIN) IVPB 1000 mg/200 mL premix 1,000 mg 200 mL/hr     HPI/Subjective: - Still with chest tenderness around the abscess and induration.  Objective: Filed Vitals:   10/05/13 1507 10/05/13 1515 10/05/13 1530 10/05/13 1554  BP: 127/70 122/69 133/79 129/74  Pulse: 81 92 90 88  Temp: 98.8 F (37.1 C)  98.8 F (37.1 C) 98.2 F (36.8 C)  TempSrc:      Resp: 18 11 16 15   Height:      Weight:      SpO2: 98% 96% 98% 97%    Intake/Output Summary (Last 24 hours) at 10/05/13  1627 Last data filed at 10/05/13 1547  Gross per 24 hour  Intake 3315.42 ml  Output      0 ml  Net 3315.42 ml   Filed Weights   10/02/13 2319 10/03/13 0628  Weight: 104.327 kg (230 lb) 92.9 kg (204 lb 12.9 oz)    Exam:  General:  NAD  Cardiovascular: regular rate and rhythm, without MRG  Respiratory: good air movement, clear to auscultation throughout, no wheezing, ronchi or rales  Abdomen: soft, not tender to palpation, positive bowel sounds  MSK: no peripheral edema; 2 abscesses status post drainage on his anterior chest, one area with 5x5 cm induration  Neuro: Nonfocal  Data Reviewed: Basic Metabolic Panel:  Recent  Labs Lab 10/03/13 0210 10/03/13 0227 10/04/13 0412  NA  --  137 132*  K  --  3.8 3.8  CL  --  99 100  CO2  --   --  23  GLUCOSE  --  334* 242*  BUN  --  13 10  CREATININE 0.86 1.10 0.80  CALCIUM  --   --  8.4  MG  --   --  2.0  PHOS  --   --  3.2   Liver Function Tests:  Recent Labs Lab 10/04/13 0412  AST 16  ALT 19  ALKPHOS 124*  BILITOT 0.4  PROT 6.6  ALBUMIN 2.8*   CBC:  Recent Labs Lab 10/03/13 0210 10/03/13 0227 10/04/13 0412  WBC 12.6*  --  9.4  NEUTROABS 9.5*  --   --   HGB 14.8 17.0 12.0*  HCT 43.6 50.0 36.2*  MCV 83.2  --  84.0  PLT 218  --  186   CBG:  Recent Labs Lab 10/04/13 1649 10/04/13 2143 10/05/13 0731 10/05/13 1130 10/05/13 1348  GLUCAP 158* 211* 225* 196* 114*    Recent Results (from the past 240 hour(s))  CULTURE, BLOOD (ROUTINE X 2)     Status: None   Collection Time    10/03/13  2:30 AM      Result Value Range Status   Specimen Description BLOOD LEFT HAND   Final   Special Requests BOTTLES DRAWN AEROBIC AND ANAEROBIC 4CC   Final   Culture  Setup Time     Final   Value: 10/03/2013 09:02     Performed at [facility]   Culture     Final   Value:        BLOOD CULTURE RECEIVED NO GROWTH TO DATE CULTURE WILL BE HELD FOR 5 DAYS BEFORE ISSUING A FINAL NEGATIVE REPORT     Performed at [facility]   Report Status PENDING   Incomplete  CULTURE, BLOOD (ROUTINE X 2)     Status: None   Collection Time    10/03/13  2:35 AM      Result Value Range Status   Specimen Description BLOOD LEFT ANTECUBITAL   Final   Special Requests BOTTLES DRAWN AEROBIC AND ANAEROBIC 5CC   Final   Culture  Setup Time     Final   Value: 10/03/2013 09:02     Performed at [facility]   Culture     Final   Value:        BLOOD CULTURE RECEIVED NO GROWTH TO DATE CULTURE WILL BE HELD FOR 5 DAYS BEFORE ISSUING A FINAL NEGATIVE REPORT     Performed at [facility]   Report Status PENDING   Incomplete  URINE CULTURE  Status:  None   Collection Time    10/03/13  2:51 AM      Result Value Range Status   Specimen Description URINE, CLEAN CATCH   Final   Special Requests NONE   Final   Culture  Setup Time     Final   Value: 10/03/2013 10:55     Performed at [facility] Count     Final   Value: NO GROWTH     Performed at [facility]   Culture     Final   Value: NO GROWTH     Performed at [facility]   Report Status 10/04/2013 FINAL   Final  CULTURE, ROUTINE-ABSCESS     Status: None   Collection Time    10/03/13  4:28 AM      Result Value Range Status   Specimen Description WOUND SMALL ABSCESS   Final   Special Requests NONE   Final   Gram Stain     Final   Value: MODERATE WBC PRESENT, PREDOMINANTLY PMN     NO SQUAMOUS EPITHELIAL CELLS SEEN     FEW GRAM POSITIVE COCCI IN PAIRS     IN CLUSTERS     Performed at [facility]   Culture     Final   Value: MODERATE METHICILLIN RESISTANT STAPHYLOCOCCUS AUREUS     Note: RIFAMPIN AND GENTAMICIN SHOULD NOT BE USED AS SINGLE DRUGS FOR TREATMENT OF STAPH INFECTIONS. This organism DOES NOT demonstrate inducible Clindamycin resistance in vitro. CRITICAL RESULT CALLED TO, READ BACK BY AND VERIFIED WITH: EMILY H 12/4      @1130  BY REAMM     Performed at [facility]   Report Status 10/05/2013 FINAL   Final   Organism ID, Bacteria METHICILLIN RESISTANT STAPHYLOCOCCUS AUREUS   Final  CULTURE, ROUTINE-ABSCESS     Status: None   Collection Time    10/03/13  4:28 AM      Result Value Range Status   Specimen Description WOUND LARGE ABSCESS   Final   Special Requests NONE   Final   Gram Stain     Final   Value: MODERATE WBC PRESENT, PREDOMINANTLY PMN     NO SQUAMOUS EPITHELIAL CELLS SEEN     MODERATE GRAM POSITIVE COCCI IN PAIRS     IN CLUSTERS     Performed at [facility]   Culture     Final   Value: MODERATE METHICILLIN RESISTANT STAPHYLOCOCCUS AUREUS     Note: RIFAMPIN AND GENTAMICIN SHOULD NOT BE USED AS  SINGLE DRUGS FOR TREATMENT OF STAPH INFECTIONS. This organism DOES NOT demonstrate inducible Clindamycin resistance in vitro. CRITICAL RESULT CALLED TO, READ BACK BY AND VERIFIED WITH: EMILY H 12/4      @1130  BY REAMM     Performed at [facility]   Report Status 10/05/2013 FINAL   Final   Organism ID, Bacteria METHICILLIN RESISTANT STAPHYLOCOCCUS AUREUS   Final  WOUND CULTURE     Status: None   Collection Time    10/04/13  7:30 AM      Result Value Range Status   Specimen Description WOUND CHEST   Final   Special Requests Normal   Final   Gram Stain     Final   Value: RARE WBC PRESENT, PREDOMINANTLY PMN     NO SQUAMOUS EPITHELIAL CELLS SEEN     FEW GRAM POSITIVE COCCI     IN PAIRS  Performed at [facility]     Final   Value: ABUNDANT STAPHYLOCOCCUS AUREUS     Note: RIFAMPIN AND GENTAMICIN SHOULD NOT BE USED AS SINGLE DRUGS FOR TREATMENT OF STAPH INFECTIONS.     Performed at [facility]   Report Status PENDING   Incomplete     Studies: No results found.  Scheduled Meds: . Chlorhexidine Gluconate Cloth  6 each Topical Q0600  . docusate sodium  100 mg Oral BID  . enoxaparin (LOVENOX) injection  40 mg Subcutaneous Q24H  . glipiZIDE  5 mg Oral QAC breakfast  . heparin  5,000 Units Subcutaneous Q8H  . HYDROmorphone      . insulin aspart  0-15 Units Subcutaneous TID WC  . insulin aspart  0-5 Units Subcutaneous QHS  . insulin glargine  12 Units Subcutaneous QHS  . metFORMIN  500 mg Oral BID WC  . mupirocin ointment  1 application Nasal BID  . sodium chloride  3 mL Intravenous Q12H  . vancomycin  1,000 mg Intravenous Q12H   Continuous Infusions: . sodium chloride 1,000 mL (10/05/13 0559)    Active Problems:   Diabetes mellitus   Skin abscess   SIRS (systemic inflammatory response syndrome)  Time spent: 25  Pamella Pert, MD [facility] Pager [phone]. If 7 PM - 7 AM, please contact night-coverage at [email or web address], password  [facility] 10/05/2013, 4:27 PM  LOS: 3 days

## 2013-10-05 NOTE — Op Note (Signed)
Surgeon: Wenda Low, MD, FACS  Asst:  none  Anes:  general  Procedure: Incision and drainage of two abscesses of the right chest( infraclavicular and medial)  Diagnosis: Staph abscesses  Complications: none  EBL:   minimal cc  Description of Procedure:  Taken to room 12 at [facility].  General admininstered and prepped with PCMX.  Draped and timeout.  I & D performed.  Irrigated and suctioned.  Packed with 1/2 inch iodophor gauze.     Matt B. Daphine Deutscher, MD, [facility], [place] [phone]

## 2013-10-06 ENCOUNTER — Encounter (HOSPITAL_COMMUNITY): Payer: Self-pay | Admitting: Surgery

## 2013-10-06 LAB — CBC
HCT: 36.5 % — ABNORMAL LOW (ref 39.0–52.0)
Platelets: 225 10*3/uL (ref 150–400)
RDW: 11.8 % (ref 11.5–15.5)
WBC: 9.2 10*3/uL (ref 4.0–10.5)

## 2013-10-06 LAB — WOUND CULTURE

## 2013-10-06 LAB — BASIC METABOLIC PANEL
BUN: 7 mg/dL (ref 6–23)
Calcium: 8.8 mg/dL (ref 8.4–10.5)
Chloride: 105 mEq/L (ref 96–112)
GFR calc Af Amer: >90 mL/min (ref >90)
Potassium: 3.6 mEq/L (ref 3.5–5.1)
Sodium: 137 mEq/L (ref 135–145)

## 2013-10-06 LAB — GLUCOSE, CAPILLARY: Glucose-Capillary: 191 mg/dL — ABNORMAL HIGH (ref 70–99)

## 2013-10-06 MED ORDER — DOXYCYCLINE HYCLATE 100 MG PO TABS: 100.0000 mg | ORAL_TABLET | Freq: Two times a day (BID) | ORAL | Status: DC

## 2013-10-06 MED ORDER — TRAMADOL HCL 50 MG PO TABS: 50.0000 mg | ORAL_TABLET | Freq: Four times a day (QID) | ORAL | Status: DC | PRN

## 2013-10-06 MED ORDER — DOXYCYCLINE HYCLATE 100 MG PO TABS
100.0000 mg | ORAL_TABLET | Freq: Two times a day (BID) | ORAL | Status: DC
  Administered 2013-10-06: 12:00:00 100 mg via ORAL
  Filled 2013-10-06 (×2): qty 1

## 2013-10-06 MED ORDER — INSULIN GLARGINE 100 UNIT/ML ~~LOC~~ SOLN: 10.0000 [IU] | Freq: Every day | SUBCUTANEOUS | Status: DC

## 2013-10-06 MED ORDER — VANCOMYCIN HCL IN DEXTROSE 1-5 GM/200ML-% IV SOLN
1000.0000 mg | Freq: Three times a day (TID) | INTRAVENOUS | Status: DC
  Administered 2013-10-06: 05:00:00 1000 mg via INTRAVENOUS
  Filled 2013-10-06 (×2): qty 200

## 2013-10-06 NOTE — Progress Notes (Signed)
ANTIBIOTIC CONSULT NOTE - F/U  Pharmacy Consult for Vancomycin and Zosyn  Indication: rule out sepsis  No Known Allergies  Patient Measurements: Height: 6' (182.9 cm) Weight: 204 lb 12.9 oz (92.9 kg) IBW/kg (Calculated) : 77.6 Adjusted Body Weight:   Vital Signs: Temp: 98.4 F (36.9 C) (12/04 2054) Temp src: Oral (12/04 2054) BP: 142/83 mmHg (12/04 2054) Pulse Rate: 99 (12/04 2054) Intake/Output from previous day: 12/04 0701 - 12/05 0700 In: 812.5 [I.V.:800; IV Piggyback:12.5] Out: -  Intake/Output from this shift:    Labs:  Recent Labs  10/03/13 0210 10/03/13 0227 10/04/13 0412 10/05/13 1647  WBC 12.6*  --  9.4 8.1  HGB 14.8 17.0 12.0* 12.3*  PLT 218  --  186 215  CREATININE 0.86 1.10 0.80 0.69   Estimated Creatinine Clearance: 137.4 ml/min (by C-G formula based on Cr of 0.69).  Recent Labs  10/05/13 2215  VANCOTROUGH <5.0*     Microbiology: Recent Results (from the past 720 hour(s))  CULTURE, BLOOD (ROUTINE X 2)     Status: None   Collection Time    10/03/13  2:30 AM      Result Value Range Status   Specimen Description BLOOD LEFT HAND   Final   Special Requests BOTTLES DRAWN AEROBIC AND ANAEROBIC 4CC   Final   Culture  Setup Time     Final   Value: 10/03/2013 09:02     Performed at [facility]   Culture     Final   Value:        BLOOD CULTURE RECEIVED NO GROWTH TO DATE CULTURE WILL BE HELD FOR 5 DAYS BEFORE ISSUING A FINAL NEGATIVE REPORT     Performed at [facility]   Report Status PENDING   Incomplete  CULTURE, BLOOD (ROUTINE X 2)     Status: None   Collection Time    10/03/13  2:35 AM      Result Value Range Status   Specimen Description BLOOD LEFT ANTECUBITAL   Final   Special Requests BOTTLES DRAWN AEROBIC AND ANAEROBIC 5CC   Final   Culture  Setup Time     Final   Value: 10/03/2013 09:02     Performed at [facility]   Culture     Final   Value:        BLOOD CULTURE RECEIVED NO GROWTH TO DATE CULTURE WILL BE  HELD FOR 5 DAYS BEFORE ISSUING A FINAL NEGATIVE REPORT     Performed at [facility]   Report Status PENDING   Incomplete  URINE CULTURE     Status: None   Collection Time    10/03/13  2:51 AM      Result Value Range Status   Specimen Description URINE, CLEAN CATCH   Final   Special Requests NONE   Final   Culture  Setup Time     Final   Value: 10/03/2013 10:55     Performed at [facility] Count     Final   Value: NO GROWTH     Performed at [facility]   Culture     Final   Value: NO GROWTH     Performed at [facility]   Report Status 10/04/2013 FINAL   Final  CULTURE, ROUTINE-ABSCESS     Status: None   Collection Time    10/03/13  4:28 AM      Result Value Range Status   Specimen Description WOUND  SMALL ABSCESS   Final   Special Requests NONE   Final   Gram Stain     Final   Value: MODERATE WBC PRESENT, PREDOMINANTLY PMN     NO SQUAMOUS EPITHELIAL CELLS SEEN     FEW GRAM POSITIVE COCCI IN PAIRS     IN CLUSTERS     Performed at [facility]   Culture     Final   Value: MODERATE METHICILLIN RESISTANT STAPHYLOCOCCUS AUREUS     Note: RIFAMPIN AND GENTAMICIN SHOULD NOT BE USED AS SINGLE DRUGS FOR TREATMENT OF STAPH INFECTIONS. This organism DOES NOT demonstrate inducible Clindamycin resistance in vitro. CRITICAL RESULT CALLED TO, READ BACK BY AND VERIFIED WITH: EMILY H 12/4      @1130  BY REAMM     Performed at [facility]   Report Status 10/05/2013 FINAL   Final   Organism ID, Bacteria METHICILLIN RESISTANT STAPHYLOCOCCUS AUREUS   Final  CULTURE, ROUTINE-ABSCESS     Status: None   Collection Time    10/03/13  4:28 AM      Result Value Range Status   Specimen Description WOUND LARGE ABSCESS   Final   Special Requests NONE   Final   Gram Stain     Final   Value: MODERATE WBC PRESENT, PREDOMINANTLY PMN     NO SQUAMOUS EPITHELIAL CELLS SEEN     MODERATE GRAM POSITIVE COCCI IN PAIRS     IN CLUSTERS     Performed at  [facility]   Culture     Final   Value: MODERATE METHICILLIN RESISTANT STAPHYLOCOCCUS AUREUS     Note: RIFAMPIN AND GENTAMICIN SHOULD NOT BE USED AS SINGLE DRUGS FOR TREATMENT OF STAPH INFECTIONS. This organism DOES NOT demonstrate inducible Clindamycin resistance in vitro. CRITICAL RESULT CALLED TO, READ BACK BY AND VERIFIED WITH: EMILY H 12/4      @1130  BY REAMM     Performed at [facility]   Report Status 10/05/2013 FINAL   Final   Organism ID, Bacteria METHICILLIN RESISTANT STAPHYLOCOCCUS AUREUS   Final  WOUND CULTURE     Status: None   Collection Time    10/04/13  7:30 AM      Result Value Range Status   Specimen Description WOUND CHEST   Final   Special Requests Normal   Final   Gram Stain     Final   Value: RARE WBC PRESENT, PREDOMINANTLY PMN     NO SQUAMOUS EPITHELIAL CELLS SEEN     FEW GRAM POSITIVE COCCI     IN PAIRS     Performed at [facility]   Culture     Final   Value: ABUNDANT STAPHYLOCOCCUS AUREUS     Note: RIFAMPIN AND GENTAMICIN SHOULD NOT BE USED AS SINGLE DRUGS FOR TREATMENT OF STAPH INFECTIONS.     Performed at [facility]   Report Status PENDING   Incomplete    Medical History: Past Medical History  Diagnosis Date  . Diabetes mellitus     Medications:  Anti-infectives   Start     Dose/Rate Route Frequency Ordered Stop   10/06/13 0600  vancomycin (VANCOCIN) IVPB 1000 mg/200 mL premix     1,000 mg 200 mL/hr over 60 Minutes Intravenous Every 8 hours 10/06/13 0050     10/03/13 1000  vancomycin (VANCOCIN) IVPB 1000 mg/200 mL premix  Status:  Discontinued     1,000 mg 200 mL/hr over 60 Minutes Intravenous Every 12 hours 10/03/13  0255 10/06/13 0050   10/03/13 0800  piperacillin-tazobactam (ZOSYN) IVPB 3.375 g  Status:  Discontinued     3.375 g 12.5 mL/hr over 240 Minutes Intravenous 3 times per day 10/03/13 0255 10/05/13 1245   10/03/13 0300  piperacillin-tazobactam (ZOSYN) IVPB 3.375 g     3.375 g 100 mL/hr over 30  Minutes Intravenous  Once 10/03/13 0246 10/03/13 0329   10/03/13 0300  vancomycin (VANCOCIN) IVPB 1000 mg/200 mL premix     1,000 mg 200 mL/hr over 60 Minutes Intravenous  Once 10/03/13 0246 10/03/13 0442     Assessment:  VT=<5 after 1Gm q12h @ Css   Goal of Therapy:  Vancomycin trough level 15-20 mcg/ml Zosyn based on renal function   Plan:   Increase Vancomycin to 1Gm IV q8h  F/U SCr/levels/cultures as needed  Susanne Greenhouse R 10/06/2013,1:06 AM

## 2013-10-06 NOTE — Progress Notes (Signed)
1 Day Post-Op  Subjective: Doing well, to go home today.  Packing removed.    Objective: Vital signs in last 24 hours: Temp:  [98.2 F (36.8 C)-98.8 F (37.1 C)] 98.3 F (36.8 C) (12/05 0622) Pulse Rate:  [70-99] 70 (12/05 0622) Resp:  [11-18] 16 (12/05 0622) BP: (122-142)/(65-83) 125/65 mmHg (12/05 0622) SpO2:  [96 %-98 %] 97 % (12/05 0622) Last BM Date: 10/02/13  Intake/Output from previous day: 12/04 0701 - 12/05 0700 In: 2439.6 [I.V.:2227.1; IV Piggyback:212.5] Out: -  Intake/Output this shift: Total I/O In: 320 [P.O.:320] Out: -   General appearance: alert, cooperative and no distress Skin: sites clean packing out  Lab Results:   Recent Labs  10/05/13 1647 10/06/13 0451  WBC 8.1 9.2  HGB 12.3* 11.9*  HCT 37.6* 36.5*  PLT 215 225    BMET  Recent Labs  10/04/13 0412 10/05/13 1647 10/06/13 0451  NA 132*  --  137  K 3.8  --  3.6  CL 100  --  105  CO2 23  --  22  GLUCOSE 242*  --  171*  BUN 10  --  7  CREATININE 0.80 0.69 0.64  CALCIUM 8.4  --  8.8   PT/INR No results found for this basename: LABPROT, INR,  in the last 72 hours   Recent Labs Lab 10/04/13 0412  AST 16  ALT 19  ALKPHOS 124*  BILITOT 0.4  PROT 6.6  ALBUMIN 2.8*     Lipase  No results found for this basename: lipase     Studies/Results: No results found.  Medications: . Chlorhexidine Gluconate Cloth  6 each Topical Q0600  . docusate sodium  100 mg Oral BID  . doxycycline  100 mg Oral Q12H  . glipiZIDE  5 mg Oral QAC breakfast  . heparin  5,000 Units Subcutaneous Q8H  . insulin aspart  0-15 Units Subcutaneous TID WC  . insulin aspart  0-5 Units Subcutaneous QHS  . insulin glargine  12 Units Subcutaneous QHS  . metFORMIN  500 mg Oral BID WC  . mupirocin ointment  1 application Nasal BID  . sodium chloride  3 mL Intravenous Q12H    Assessment/Plan 1. Skin abscess x 2 right chest  2. AODM  3. Hypertension  Plan:  Home today wash with soap and water bid or more.   Dry dressing follow up as needed in 2 weeks as needed.       LOS: 4 days    Leshea Jaggers 10/06/2013

## 2013-10-06 NOTE — Discharge Summary (Signed)
Physician Discharge Summary  Luke Abbott BJY:[id number] DOB: 1976/09/17 DOA: 10/02/2013  PCP: [name], MD  Admit date: 10/02/2013 Discharge date: 10/06/2013  Time spent: 35 minutes  Recommendations for Outpatient Follow-up:  1. Followup with primary care provider in about 1 week after discharge  2. Followup with surgery in 2 weeks  Recommendations for primary care physician for things to follow:  Sugar control  Discharge Diagnoses:  Active Problems:   Diabetes mellitus   Skin abscess   SIRS (systemic inflammatory response syndrome)  Discharge Condition: Stable  Diet recommendation: Diabetic  Filed Weights   10/02/13 2319 10/03/13 0628  Weight: 104.327 kg (230 lb) 92.9 kg (204 lb 12.9 oz)   History of present illness:  Luke Abbott is a 37 y.o. male has a past medical history of Diabetes mellitus. Presented with  Two areas of swelling on the chest for the past 1 week. He was not feeling well and had severe  pain. Presented to ER and was found to be febrile up to 101.9. With two abscesses on his chest that were drained. Patient is diabetic controlled by metformin. Denies any chest pain. NO SOB. Hospitalist asked to admit given history of fever tachycardia in a diabetic patient worrisome for early sepsis.   Hospital Course:  Patient was admitted and given initial concern for SIRS, he was started on broad-spectrum antibiotics with vancomycin and Zosyn. He underwent an I&D in the emergency room, and cultures were sent at that time. Surgery was consulted while patient was hospitalized because he is still having an area of subcutaneous induration and tenderness to palpation, and he underwent a repeat I&D in the operating room on 10/05/2013. He was reevaluated by surgery on 10/06/2013 and he is to followup with them as an outpatient in about 2 weeks. Culture data showed MRSA sensitive to Bactrim and tetracycline. Patient's vancomycin was discontinued and he was placed on doxycycline, and  he is to complete a 14 day outpatient course. Advised him to followup with his PCP in about a week and followup with surgery as instructed, and based on reevaluation of his wound and symptoms to consider a longer course. Patient has a history of diabetes mellitus on oral agents at home, and he tells me that he is not tolerating metformin really well and has cut down his dose to 500 twice a day. Repeat hemoglobin A1c showed poor control of 11.5, and at this point I think it's safe to start him on long-acting insulin. He was placed on Lantus here, with good sugars controlled. Was advised to check his sugars and followup with his PCP in a week. I suspect that she will have a better sugars control once his infection is resolving.  Procedures:  I&D 12/2 and 12/4  Consultations:  Surgery  Discharge Exam: Filed Vitals:   10/05/13 1554 10/05/13 2054 10/06/13 0347 10/06/13 0622  BP: 129/74 142/83 124/72 125/65  Pulse: 88 99 83 70  Temp: 98.2 F (36.8 C) 98.4 F (36.9 C) 98.3 F (36.8 C) 98.3 F (36.8 C)  TempSrc:  Oral Oral Oral  Resp: 15 15 16 16   Height:      Weight:      SpO2: 97% 97% 98% 97%   General: No acute distress Cardiovascular: Regular rate and rhythm Respiratory: Clear to auscultation bilaterally  Discharge Instructions     Medication List         doxycycline 100 MG tablet  Commonly known as:  VIBRA-TABS  Take 1 tablet (100  mg total) by mouth every 12 (twelve) hours.     glipiZIDE 5 MG tablet  Commonly known as:  GLUCOTROL  Take 5 mg by mouth daily before breakfast.     insulin glargine 100 UNIT/ML injection  Commonly known as:  LANTUS  Inject 0.1 mLs (10 Units total) into the skin at bedtime.     metFORMIN 1000 MG tablet  Commonly known as:  GLUCOPHAGE  Take 1,000 mg by mouth 2 (two) times daily with a meal.     traMADol 50 MG tablet  Commonly known as:  ULTRAM  Take 1 tablet (50 mg total) by mouth every 6 (six) hours as needed.           Follow-up  Information   Follow up with [name], MD. Schedule an appointment as soon as possible for a visit in 1 week.   Specialty:  Family Medicine   Contact information:   [address] [place] [address] [phone]       Follow up with Luretha Murphy B, MD. Schedule an appointment as soon as possible for a visit in 2 weeks.   Specialty:  General Surgery   Contact information:   [address] [place] [address] [phone]       The results of significant diagnostics from this hospitalization (including imaging, microbiology, ancillary and laboratory) are listed below for reference.    Significant Diagnostic Studies: No results found.  Microbiology: Recent Results (from the past 240 hour(s))  CULTURE, BLOOD (ROUTINE X 2)     Status: None   Collection Time    10/03/13  2:30 AM      Result Value Range Status   Specimen Description BLOOD LEFT HAND   Final   Special Requests BOTTLES DRAWN AEROBIC AND ANAEROBIC 4CC   Final   Culture  Setup Time     Final   Value: 10/03/2013 09:02     Performed at [facility]   Culture     Final   Value:        BLOOD CULTURE RECEIVED NO GROWTH TO DATE CULTURE WILL BE HELD FOR 5 DAYS BEFORE ISSUING A FINAL NEGATIVE REPORT     Performed at [facility]   Report Status PENDING   Incomplete  CULTURE, BLOOD (ROUTINE X 2)     Status: None   Collection Time    10/03/13  2:35 AM      Result Value Range Status   Specimen Description BLOOD LEFT ANTECUBITAL   Final   Special Requests BOTTLES DRAWN AEROBIC AND ANAEROBIC 5CC   Final   Culture  Setup Time     Final   Value: 10/03/2013 09:02     Performed at [facility]   Culture     Final   Value:        BLOOD CULTURE RECEIVED NO GROWTH TO DATE CULTURE WILL BE HELD FOR 5 DAYS BEFORE ISSUING A FINAL NEGATIVE REPORT     Performed at [facility]   Report Status PENDING   Incomplete  URINE CULTURE     Status: None   Collection Time    10/03/13   2:51 AM      Result Value Range Status   Specimen Description URINE, CLEAN CATCH   Final   Special Requests NONE   Final   Culture  Setup Time     Final   Value: 10/03/2013 10:55     Performed at [facility]   [place] [facility]     Final   Value: NO GROWTH     Performed at [facility]     Final   Value: NO GROWTH     Performed at [facility]   Report Status 10/04/2013 FINAL   Final  CULTURE, ROUTINE-ABSCESS     Status: None   Collection Time    10/03/13  4:28 AM      Result Value Range Status   Specimen Description WOUND SMALL ABSCESS   Final   Special Requests NONE   Final   Gram Stain     Final   Value: MODERATE WBC PRESENT, PREDOMINANTLY PMN     NO SQUAMOUS EPITHELIAL CELLS SEEN     FEW GRAM POSITIVE COCCI IN PAIRS     IN CLUSTERS     Performed at [facility]   Culture     Final   Value: MODERATE METHICILLIN RESISTANT STAPHYLOCOCCUS AUREUS     Note: RIFAMPIN AND GENTAMICIN SHOULD NOT BE USED AS SINGLE DRUGS FOR TREATMENT OF STAPH INFECTIONS. This organism DOES NOT demonstrate inducible Clindamycin resistance in vitro. CRITICAL RESULT CALLED TO, READ BACK BY AND VERIFIED WITH: EMILY H 12/4      @1130  BY REAMM     Performed at [facility]   Report Status 10/05/2013 FINAL   Final   Organism ID, Bacteria METHICILLIN RESISTANT STAPHYLOCOCCUS AUREUS   Final  CULTURE, ROUTINE-ABSCESS     Status: None   Collection Time    10/03/13  4:28 AM      Result Value Range Status   Specimen Description WOUND LARGE ABSCESS   Final   Special Requests NONE   Final   Gram Stain     Final   Value: MODERATE WBC PRESENT, PREDOMINANTLY PMN     NO SQUAMOUS EPITHELIAL CELLS SEEN     MODERATE GRAM POSITIVE COCCI IN PAIRS     IN CLUSTERS     Performed at [facility]   Culture     Final   Value: MODERATE METHICILLIN RESISTANT STAPHYLOCOCCUS AUREUS     Note: RIFAMPIN AND GENTAMICIN SHOULD NOT BE USED AS SINGLE DRUGS FOR TREATMENT OF STAPH  INFECTIONS. This organism DOES NOT demonstrate inducible Clindamycin resistance in vitro. CRITICAL RESULT CALLED TO, READ BACK BY AND VERIFIED WITH: EMILY H 12/4      @1130  BY REAMM     Performed at [facility]   Report Status 10/05/2013 FINAL   Final   Organism ID, Bacteria METHICILLIN RESISTANT STAPHYLOCOCCUS AUREUS   Final  WOUND CULTURE     Status: None   Collection Time    10/04/13  7:30 AM      Result Value Range Status   Specimen Description WOUND CHEST   Final   Special Requests Normal   Final   Gram Stain     Final   Value: RARE WBC PRESENT, PREDOMINANTLY PMN     NO SQUAMOUS EPITHELIAL CELLS SEEN     FEW GRAM POSITIVE COCCI     IN PAIRS     Performed at [facility]   Culture     Final   Value: ABUNDANT METHICILLIN RESISTANT STAPHYLOCOCCUS AUREUS     Note: RIFAMPIN AND GENTAMICIN SHOULD NOT BE USED AS SINGLE DRUGS FOR TREATMENT OF STAPH INFECTIONS. This organism DOES NOT demonstrate inducible Clindamycin resistance in vitro. CRITICAL RESULT CALLED TO, READ BACK BY AND VERIFIED WITH: JESSICA [employer]  10/06/13 1055 BY SMITHERSJ     Performed at [facility]   Report Status 10/06/2013 FINAL   Final   Organism ID, Bacteria METHICILLIN RESISTANT STAPHYLOCOCCUS AUREUS   Final     Labs: Basic Metabolic Panel:  Recent Labs Lab 10/03/13 0210 10/03/13 0227 10/04/13 0412 10/05/13 1647 10/06/13 0451  NA  --  137 132*  --  137  K  --  3.8 3.8  --  3.6  CL  --  99 100  --  105  CO2  --   --  23  --  22  GLUCOSE  --  334* 242*  --  171*  BUN  --  13 10  --  7  CREATININE 0.86 1.10 0.80 0.69 0.64  CALCIUM  --   --  8.4  --  8.8  MG  --   --  2.0  --   --   PHOS  --   --  3.2  --   --    Liver Function Tests:  Recent Labs Lab 10/04/13 0412  AST 16  ALT 19  ALKPHOS 124*  BILITOT 0.4  PROT 6.6  ALBUMIN 2.8*   CBC:  Recent Labs Lab 10/03/13 0210 10/03/13 0227 10/04/13 0412 10/05/13 1647 10/06/13 0451  WBC 12.6*  --  9.4 8.1 9.2   NEUTROABS 9.5*  --   --   --   --   HGB 14.8 17.0 12.0* 12.3* 11.9*  HCT 43.6 50.0 36.2* 37.6* 36.5*  MCV 83.2  --  84.0 84.5 84.5  PLT 218  --  186 215 225   CBG:  Recent Labs Lab 10/05/13 1516 10/05/13 1650 10/05/13 2053 10/06/13 0717 10/06/13 1200  GLUCAP 94 110* 254* 191* 145*    Signed:  Ankit Degregorio  [facility] 10/06/2013, 2:35 PM

## 2013-10-09 ENCOUNTER — Encounter: Payer: Self-pay | Admitting: Internal Medicine

## 2013-10-09 LAB — CULTURE, BLOOD (ROUTINE X 2)
Culture: NO GROWTH
Culture: NO GROWTH

## 2013-10-16 ENCOUNTER — Encounter: Payer: Self-pay | Admitting: Internal Medicine

## 2013-10-16 ENCOUNTER — Ambulatory Visit (INDEPENDENT_AMBULATORY_CARE_PROVIDER_SITE_OTHER): Payer: Managed Care, Other (non HMO) | Admitting: Internal Medicine

## 2013-10-16 ENCOUNTER — Telehealth: Payer: Self-pay

## 2013-10-16 VITALS — BP 132/82 | HR 77 | Temp 98.3°F | Ht 72.0 in | Wt 204.8 lb

## 2013-10-16 DIAGNOSIS — E119 Type 2 diabetes mellitus without complications: Secondary | ICD-10-CM

## 2013-10-16 DIAGNOSIS — L0291 Cutaneous abscess, unspecified: Secondary | ICD-10-CM

## 2013-10-16 MED ORDER — INSULIN GLARGINE 100 UNIT/ML ~~LOC~~ SOLN: 10.0000 [IU] | Freq: Every day | SUBCUTANEOUS | Status: DC

## 2013-10-16 NOTE — Telephone Encounter (Signed)
Pt just seen by Nicki Reaper NP and thought Lantus was sent to [facility]. Pt said was recently started on lantus when in the hospital pt taking 10 units of lantus at bedtime;but spoke with Marchelle Folks at [facility] and has no order for Lantus ever. pts med list appears lantus ordered by Dr Elvera Lennox on 10/06/13. Please advise.Pt request cb.

## 2013-10-16 NOTE — Telephone Encounter (Signed)
Spoke to Nicki Reaper, NP and Rx sent in to pharmacy as requested.

## 2013-10-16 NOTE — Assessment & Plan Note (Signed)
Uncontrolled Hospital notes reviewed- A1C 11.2 Noncompliant with diet, exercise or medications  Will recheck your A1C in 2 months We will likely go up on the Lantus

## 2013-10-16 NOTE — Progress Notes (Signed)
Subjective:    Patient ID: Luke Abbott, male    DOB: 1975-11-22, 37 y.o.   MRN: [id number]  HPI  Pt presents to the clinic today for hospital followup. He was noted to have 2 tender abscess on his chest. He went to the ER where he was admitted for MRSA and poorly controlled diabetes. His abscess were drained in the ER on 12/1 and again in the OR on 12/4. He was placed on IV antibiotics and then discharged home on PO doxycycline. He has not yet finished up his antibiotic. He has a followup with surgery next week. His A1C in the hospital showed to be 11.2. He told the physician he was not tolerating the metformin so he cut back the dose himself to 500 mg BID. He was started on Lantus injections in the hospital. Since that time, he has not picked up any of his medication that was prescribed at discharge. He is non compliant with diet. He does not test his sugars.  Review of Systems      Past Medical History  Diagnosis Date  . Diabetes mellitus     Current Outpatient Prescriptions  Medication Sig Dispense Refill  . doxycycline (VIBRA-TABS) 100 MG tablet Take 1 tablet (100 mg total) by mouth every 12 (twelve) hours.  28 tablet  0  . glipiZIDE (GLUCOTROL) 5 MG tablet Take 5 mg by mouth daily before breakfast.      . insulin glargine (LANTUS) 100 UNIT/ML injection Inject 0.1 mLs (10 Units total) into the skin at bedtime.  10 mL  12  . metFORMIN (GLUCOPHAGE) 1000 MG tablet Take 1,000 mg by mouth 2 (two) times daily with a meal.      . traMADol (ULTRAM) 50 MG tablet Take 1 tablet (50 mg total) by mouth every 6 (six) hours as needed.  30 tablet  0   No current facility-administered medications for this visit.    No Known Allergies  Family History  Problem Relation Age of Onset  . Hypertension Father     History   Social History  . Marital Status: Legally Separated    Spouse Name: [name]    Number of Children: 7  . Years of Education: N/A   Occupational History  .  [name]   Social History Main Topics  . Smoking status: Never Smoker   . Smokeless tobacco: Never Used  . Alcohol Use: Yes     Comment: socially  . Drug Use: No  . Sexual Activity: Yes   Other Topics Concern  . Not on file   Social History Narrative  . No narrative on file     Constitutional: Denies fever, malaise, fatigue, headache or abrupt weight changes.  Gastrointestinal: Denies abdominal pain, bloating, constipation, diarrhea or blood in the stool.   Skin: Pt reports lumps on anterior chest. Denies redness, rashes, lesions or ulcercations.  Neurological: Denies dizziness, difficulty with memory, difficulty with speech or problems with balance and coordination.   No other specific complaints in a complete review of systems (except as listed in HPI above).  Objective:   Physical Exam   BP 132/82  Pulse 77  Temp(Src) 98.3 F (36.8 C) (Oral)  Ht 6' (1.829 m)  Wt 204 lb 12 oz (92.874 kg)  BMI 27.76 kg/m2  SpO2 99% Wt Readings from Last 3 Encounters:  10/16/13 204 lb 12 oz (92.874 kg)  10/03/13 204 lb 12.9 oz (92.9 kg)  10/03/13 204 lb 12.9 oz (92.9 kg)  General: Appears his stated age, well developed, well nourished in NAD. Skin: Warm, dry and intact. No rashes, ulcerations noted. 2 small areas on chest appear to have been I and D. Cardiovascular: Normal rate and rhythm. S1,S2 noted.  No murmur, rubs or gallops noted. No JVD or BLE edema. No carotid bruits noted. Pulmonary/Chest: Normal effort and positive vesicular breath sounds. No respiratory distress. No wheezes, rales or ronchi noted.  Abdomen: Soft and nontender. Normal bowel sounds, no bruits noted. No distention or masses noted. Liver, spleen and kidneys non palpable. Neurological: Sensation intact. Alert and oriented. Cranial nerves II-XII intact. Coordination normal. +DTRs bilaterally.  BMET    Component Value Date/Time   NA 137 10/06/2013 0451   K 3.6 10/06/2013 0451   CL 105 10/06/2013 0451   CO2 22  10/06/2013 0451   GLUCOSE 171* 10/06/2013 0451   BUN 7 10/06/2013 0451   CREATININE 0.64 10/06/2013 0451   CALCIUM 8.8 10/06/2013 0451   GFRNONAA >90 10/06/2013 0451   GFRAA >90 10/06/2013 0451    Lipid Panel     Component Value Date/Time   CHOL 185 03/31/2012 1418   TRIG 146.0 03/31/2012 1418   HDL 40.60 03/31/2012 1418   CHOLHDL 5 03/31/2012 1418   VLDL 29.2 03/31/2012 1418   LDLCALC 115* 03/31/2012 1418    CBC    Component Value Date/Time   WBC 9.2 10/06/2013 0451   RBC 4.32 10/06/2013 0451   HGB 11.9* 10/06/2013 0451   HCT 36.5* 10/06/2013 0451   PLT 225 10/06/2013 0451   MCV 84.5 10/06/2013 0451   MCH 27.5 10/06/2013 0451   MCHC 32.6 10/06/2013 0451   RDW 11.8 10/06/2013 0451   LYMPHSABS 2.0 10/03/2013 0210   MONOABS 1.0 10/03/2013 0210   EOSABS 0.1 10/03/2013 0210   BASOSABS 0.0 10/03/2013 0210    Hgb A1C Lab Results  Component Value Date   HGBA1C 11.5* 10/03/2013        Assessment & Plan:   Abscess of skin, MRSA:  Continue your doxycycline Follow up with surgeon next week  RTC in 2 months or sooner if needed

## 2013-10-16 NOTE — Progress Notes (Signed)
Pre-visit discussion using our clinic review tool. No additional management support is needed unless otherwise documented below in the visit note.  

## 2013-10-16 NOTE — Patient Instructions (Signed)

## 2013-10-16 NOTE — Telephone Encounter (Signed)
Spoke to pt and informed him that Rx has been approved by [employer], NP and has been sent to requested pharmacy

## 2013-10-19 ENCOUNTER — Encounter (INDEPENDENT_AMBULATORY_CARE_PROVIDER_SITE_OTHER): Payer: Self-pay | Admitting: Surgery

## 2013-10-19 ENCOUNTER — Other Ambulatory Visit: Payer: Self-pay | Admitting: *Deleted

## 2013-10-19 ENCOUNTER — Telehealth: Payer: Self-pay

## 2013-10-19 ENCOUNTER — Ambulatory Visit (INDEPENDENT_AMBULATORY_CARE_PROVIDER_SITE_OTHER): Payer: Managed Care, Other (non HMO) | Admitting: Surgery

## 2013-10-19 ENCOUNTER — Encounter (INDEPENDENT_AMBULATORY_CARE_PROVIDER_SITE_OTHER): Payer: Self-pay | Admitting: General Surgery

## 2013-10-19 ENCOUNTER — Encounter: Payer: Self-pay | Admitting: *Deleted

## 2013-10-19 VITALS — BP 112/74 | HR 72 | Temp 97.4°F | Resp 16 | Ht 72.0 in | Wt 209.2 lb

## 2013-10-19 DIAGNOSIS — L0291 Cutaneous abscess, unspecified: Secondary | ICD-10-CM

## 2013-10-19 MED ORDER — INSULIN GLARGINE 100 UNIT/ML SOLOSTAR PEN: 100.0000 mL | PEN_INJECTOR | Freq: Every day | SUBCUTANEOUS | Status: DC

## 2013-10-19 MED ORDER — INSULIN GLARGINE 100 UNIT/ML SOLOSTAR PEN: 10.0000 [IU] | PEN_INJECTOR | Freq: Every day | SUBCUTANEOUS | Status: DC

## 2013-10-19 MED ORDER — "PEN NEEDLES 5/16"" 31G X 8 MM MISC": Status: DC

## 2013-10-19 NOTE — Telephone Encounter (Signed)
Ok for you to call in pens

## 2013-10-19 NOTE — Telephone Encounter (Signed)
Pt said he does not know how to use a vial of insulin; pt was taught in hospital how to use the pen. Pt has not had lantus since 10/13/13 after discharge from hospital.offered diabetic training but pt said he feels comfortable using the pen but not the vial. Pt request lantus pen and pen needles sent to [facility]. Pt request cb.

## 2013-10-19 NOTE — Patient Instructions (Signed)
May return to full duty on Monday, Oct 23, 2013

## 2013-10-19 NOTE — Progress Notes (Signed)
Luke Abbott 37 y.o.  Body mass index is 28.37 kg/(m^2).  Patient Active Problem List   Diagnosis Date Noted  . Skin abscess 10/03/2013  . Diabetes mellitus     No Known Allergies  Past Surgical History  Procedure Laterality Date  . Irrigation and debridement abscess Right 10/05/2013    Procedure: IRRIGATION AND DEBRIDEMENT RIGHT CHEST WALL ABCESS;  Surgeon: Valarie Merino, MD;  Location: [facility];  Service: General;  Laterality: Right;  . Shoulder arthroscopy with rotator cuff repair    . Ganglion cyst excision  1993    wrist   [name], MD No diagnosis found.  Doing well after decision and drainage of 2 separate carbuncles on his right chest. These now are itching and the incisions  have small scabs. There are essentially healed and there is no evidence of infection. I think he would be fine for him to return to full duty next Monday, T. [employer] 22nd. Week in followup and see him on an as-needed basis. Matt B. Daphine Deutscher, MD, [facility] [phone] beeper [phone]  10/19/2013 11:03 AM

## 2013-10-19 NOTE — Telephone Encounter (Signed)
Tim from [employer] called verification of lantus solostar pen instructions; verified with Dr Reece Agar should be 0.1 ml or 10 units into the skin given at bedtime. Medication phoned to [facility] as instructed.

## 2013-10-20 NOTE — Telephone Encounter (Signed)
Rx for DM pens sent to requested pharmacy on 10/19/13. Spoke to pt and informed him that Rx was available for pickup

## 2013-10-20 NOTE — Telephone Encounter (Signed)
Noted. Agree.

## 2013-11-29 ENCOUNTER — Other Ambulatory Visit: Payer: Self-pay | Admitting: Family Medicine

## 2013-11-29 NOTE — Telephone Encounter (Signed)
Pt requesting medication refill. Med on Hx list only. Last f/u ov 07/2013 with future appt sched for 12/2013. pls advise

## 2013-12-18 ENCOUNTER — Encounter: Payer: Self-pay | Admitting: Family Medicine

## 2013-12-18 ENCOUNTER — Ambulatory Visit (INDEPENDENT_AMBULATORY_CARE_PROVIDER_SITE_OTHER): Payer: Managed Care, Other (non HMO) | Admitting: Family Medicine

## 2013-12-18 VITALS — BP 120/62 | HR 72 | Temp 98.2°F | Ht 71.0 in | Wt 202.5 lb

## 2013-12-18 DIAGNOSIS — E119 Type 2 diabetes mellitus without complications: Secondary | ICD-10-CM

## 2013-12-18 LAB — COMPREHENSIVE METABOLIC PANEL
ALK PHOS: 93 U/L (ref 39–117)
ALT: 19 U/L (ref 0–53)
AST: 12 U/L (ref 0–37)
Albumin: 3.9 g/dL (ref 3.5–5.2)
BILIRUBIN TOTAL: 0.8 mg/dL (ref 0.3–1.2)
BUN: 12 mg/dL (ref 6–23)
CO2: 26 meq/L (ref 19–32)
CREATININE: 0.8 mg/dL (ref 0.4–1.5)
Calcium: 8.9 mg/dL (ref 8.4–10.5)
Chloride: 101 mEq/L (ref 96–112)
GFR: 135.03 mL/min (ref >60.00)
Glucose, Bld: 304 mg/dL — ABNORMAL HIGH (ref 70–99)
Potassium: 3.9 mEq/L (ref 3.5–5.1)
SODIUM: 136 meq/L (ref 135–145)
TOTAL PROTEIN: 7.7 g/dL (ref 6.0–8.3)

## 2013-12-18 LAB — MICROALBUMIN / CREATININE URINE RATIO
Creatinine,U: 130.2 mg/dL
Microalb Creat Ratio: 2.1 mg/g (ref 0.0–30.0)
Microalb, Ur: 2.7 mg/dL — ABNORMAL HIGH (ref 0.0–1.9)

## 2013-12-18 LAB — HEMOGLOBIN A1C: Hgb A1c MFr Bld: 12.2 % — ABNORMAL HIGH (ref 4.6–6.5)

## 2013-12-18 MED ORDER — SITAGLIPTIN PHOSPHATE 100 MG PO TABS: 100.0000 mg | ORAL_TABLET | Freq: Every day | ORAL | Status: DC

## 2013-12-18 MED ORDER — ONETOUCH ULTRA SYSTEM W/DEVICE KIT: 1.0000 | PACK | Freq: Once | Status: DC

## 2013-12-18 NOTE — Progress Notes (Signed)
Subjective:    Patient ID: Luke Abbott, male    DOB: 09/26/1976, 38 y.o.   MRN: [id number]  HPI  Very pleasant 38 yo male with h/o DM here for follow up.  DM- diagnosed in 2006. Does have yearly eye exams. Checks FSBS fasting- usually in "high 100s".  Has been poorly controlled.  Started on Insulin in 10/2013.  Was supposed to be taking 10 units of Lantus at bed time, along with Metformin 1000 mg twice daily and glipizide 5 mg daily.  Stopped taking Lantus- "cant do needles." Lab Results  Component Value Date   HGBA1C 11.5* 10/03/2013   Denies any episodes of hypoglycemia. Has been exercising and has lost weight.   Wt Readings from Last 3 Encounters:  12/18/13 202 lb 8 oz (91.853 kg)  10/19/13 209 lb 3.2 oz (94.892 kg)  10/16/13 204 lb 12 oz (92.874 kg)    Patient Active Problem List   Diagnosis Date Noted  . Diabetes mellitus    Past Medical History  Diagnosis Date  . Diabetes mellitus    Past Surgical History  Procedure Laterality Date  . Irrigation and debridement abscess Right 10/05/2013    Procedure: IRRIGATION AND DEBRIDEMENT RIGHT CHEST WALL ABCESS;  Surgeon: Valarie Merino, MD;  Location: [facility];  Service: General;  Laterality: Right;  . Shoulder arthroscopy with rotator cuff repair    . Ganglion cyst excision  1993    wrist   History  Substance Use Topics  . Smoking status: Never Smoker   . Smokeless tobacco: Never Used  . Alcohol Use: Yes     Comment: socially   Family History  Problem Relation Age of Onset  . Hypertension Father    No Known Allergies Current Outpatient Prescriptions on File Prior to Visit  Medication Sig Dispense Refill  . glipiZIDE (GLUCOTROL) 5 MG tablet Take 5 mg by mouth daily before breakfast.      . Insulin Glargine (LANTUS SOLOSTAR) 100 UNIT/ML SOPN Inject 10 Units into the skin at bedtime.  100 mL  3  . insulin glargine (LANTUS) 100 UNIT/ML injection Inject 0.1 mLs (10 Units total) into the skin at bedtime.  10 mL  12   . Insulin Glargine 100 UNIT/ML SOPN Inject 10 Units into the skin at bedtime.      . Insulin Pen Needle (PEN NEEDLES 31GX5/16") 31G X 8 MM MISC Use as directed to administer lantus.  100 each  3  . metFORMIN (GLUCOPHAGE) 1000 MG tablet Take 1,000 mg by mouth 2 (two) times daily with a meal.       No current facility-administered medications on file prior to visit.   The PMH, PSH, Social History, Family History, Medications, and allergies have been reviewed in [facility], and have been updated if relevant.   Review of Systems See HPI Has had some nausea in the heat    Objective:   Physical Exam BP 120/62  Pulse 72  Temp(Src) 98.2 F (36.8 C) (Oral)  Ht 5\' 11"  (1.803 m)  Wt 202 lb 8 oz (91.853 kg)  BMI 28.26 kg/m2  SpO2 98% General:  overweght male in NAD Eyes:  PERRL Ears:  External ear exam shows no significant lesions or deformities.  Otoscopic examination reveals clear canals, tympanic membranes are intact bilaterally without bulging, retraction, inflammation or discharge. Hearing is grossly normal bilaterally. Nose:  External nasal examination shows no deformity or inflammation. Nasal mucosa are pink and moist without lesions or exudates. Mouth:  Oral mucosa and  oropharynx without lesions or exudates.  Teeth in good repair. Neck:  no carotid bruit or thyromegaly no cervical or supraclavicular lymphadenopathy  Lungs:  Normal respiratory effort, chest expands symmetrically. Lungs are clear to auscultation, no crackles or wheezes. Heart:  Normal rate and regular rhythm. S1 and S2 normal without gallop, murmur, click, rub or other extra sounds. Abdomen:  Bowel sounds positive,abdomen soft and non-tender without masses, organomegaly or hernias noted. Pulses:  R and L posterior tibial pulses are full and equal bilaterally  Extremities:  no edema  Diabetic foot exam: Normal inspection No skin breakdown No calluses  Normal DP pulses Normal sensation to light touch and monofilament Nails  normal     Assessment & Plan:

## 2013-12-18 NOTE — Assessment & Plan Note (Signed)
Poorly controlled and has not been compliant with medication.  He truly needs insulin but refuses to use needles.  Unfortunately, he likely will not have a choice but to take insulin.  Will check labs today. Add [place], refer to diabetic teaching.

## 2013-12-18 NOTE — Patient Instructions (Signed)
Good to see you. We are adding Januvia 100 mg daily but I think you absolutely need insulin. We will call you with your diabetic nutrition referral.  We will also call you with your lab results.

## 2013-12-18 NOTE — Progress Notes (Signed)
Pre-visit discussion using our clinic review tool. No additional management support is needed unless otherwise documented below in the visit note.  

## 2013-12-19 ENCOUNTER — Other Ambulatory Visit: Payer: Self-pay | Admitting: Family Medicine

## 2013-12-19 DIAGNOSIS — E1165 Type 2 diabetes mellitus with hyperglycemia: Secondary | ICD-10-CM

## 2013-12-22 ENCOUNTER — Telehealth: Payer: Self-pay

## 2013-12-22 NOTE — Telephone Encounter (Signed)
Emmi education material mailed to pt  

## 2014-04-20 ENCOUNTER — Other Ambulatory Visit: Payer: Self-pay | Admitting: Family Medicine

## 2014-05-10 ENCOUNTER — Ambulatory Visit (INDEPENDENT_AMBULATORY_CARE_PROVIDER_SITE_OTHER): Payer: Managed Care, Other (non HMO) | Admitting: Endocrinology

## 2014-05-10 ENCOUNTER — Encounter: Payer: Self-pay | Admitting: Endocrinology

## 2014-05-10 VITALS — BP 110/70 | HR 74 | Temp 98.7°F | Ht 71.0 in | Wt 209.2 lb

## 2014-05-10 DIAGNOSIS — E119 Type 2 diabetes mellitus without complications: Secondary | ICD-10-CM

## 2014-05-10 LAB — COMPREHENSIVE METABOLIC PANEL
ALBUMIN: 3.7 g/dL (ref 3.5–5.2)
ALK PHOS: 93 U/L (ref 39–117)
ALT: 16 U/L (ref 0–53)
AST: 14 U/L (ref 0–37)
BUN: 11 mg/dL (ref 6–23)
CALCIUM: 9.2 mg/dL (ref 8.4–10.5)
CHLORIDE: 101 meq/L (ref 96–112)
CO2: 20 mEq/L (ref 19–32)
CREATININE: 0.8 mg/dL (ref 0.4–1.5)
GFR: 132.88 mL/min (ref >60.00)
Glucose, Bld: 322 mg/dL — ABNORMAL HIGH (ref 70–99)
POTASSIUM: 4 meq/L (ref 3.5–5.1)
Sodium: 131 mEq/L — ABNORMAL LOW (ref 135–145)
Total Bilirubin: 0.4 mg/dL (ref 0.2–1.2)
Total Protein: 7.3 g/dL (ref 6.0–8.3)

## 2014-05-10 LAB — HEMOGLOBIN A1C: HEMOGLOBIN A1C: 11.4 % — AB (ref 4.6–6.5)

## 2014-05-10 LAB — TSH: TSH: 0.45 u[IU]/mL (ref 0.35–4.50)

## 2014-05-10 MED ORDER — GLUCOSE BLOOD VI STRP: 1.0000 | ORAL_STRIP | Freq: Two times a day (BID) | Status: AC

## 2014-05-10 MED ORDER — GLIPIZIDE 5 MG PO TABS: 5.0000 mg | ORAL_TABLET | Freq: Two times a day (BID) | ORAL | Status: DC

## 2014-05-10 MED ORDER — INSULIN GLARGINE 100 UNIT/ML SOLOSTAR PEN: PEN_INJECTOR | SUBCUTANEOUS | Status: DC

## 2014-05-10 MED ORDER — INSULIN PEN NEEDLE 32G X 4 MM MISC: Status: DC

## 2014-05-10 NOTE — Progress Notes (Signed)
REASON FOR VISIT- Luke Abbott is a 38 y.o.-year-old male, referred by Lucille Passy, MD, for management of Type 2 Diabetes Mellitus, uncontrolled, without complications.  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 Following this, he was started on Metformin and Glipizide. Now he is taking Metformin 1000 mg twice daily, reports compliance and takes his Metformin in the morning and afternoon. He is also on Glipizide 5 mg daily in the morning and Januvia 100 mg daily (since Feb 2015). Around 2014, he was hospitalized for a MRSA abscess, and was started on lantus 10 units [place] daily as well for his high sugars. However , he reports that he did not start this due to fear of injections and was expecting it to be in a pen form.      His most recent  A1cs were recorded at  Lab Results  Component Value Date   HGBA1C 12.2* 12/18/2013   HGBA1C 11.5* 10/03/2013   HGBA1C 9.6* 04/20/2013      Patient doesn't checks his sugars  Regularly. Checks when feels funny or feels that it is dropping. Checking 5-6 times a month. One touch ultra meter.  By recall his sugars are- PREMEAL Breakfast Lunch Dinner Bedtime Overall  Glucose range: 170    200s  Mean/median:         Hypoglycemia-  Reports 2-3 lows , once every month after prolonged fasting. Lowest sugar was 99; he has hypoglycemia awareness at 100s. No previous hypoglycemia admission. Hyperglycemia- Highest sugar was 330- 360 recently. No previous DKA admissions.    Dietary Habits- Eats two-three times daily. Limits Carbohydrate- using baked foods recently. Has appt for dietician at [facility], hasnt been able to go there due to work schedule. Limits  to diet Sodas/ limits sweetened beverages. Exercise- Walks daily at work. Physicist, medical, works in Production designer, theatre/television/film at [employer], [employer]. Reports Polydipsia and polyuria while at work due to work conditions and activity.   Weight-  fluctuates , recently slightly down recently.   Diabetes Complications- No known complications Nephropathy-- No  CKD, last BUN/creatinine- Lab Results  Component Value Date   BUN 12 12/18/2013   CREATININE 0.8 12/18/2013    Lab Results  Component Value Date   HGBA1C 12.2* 12/18/2013   HGBA1C 11.5* 10/03/2013   HGBA1C 9.6* 04/20/2013   Lab Results  Component Value Date   MICROALBUR 2.7* 12/18/2013   LDLCALC 115* 03/31/2012   CREATININE 0.8 12/18/2013   Retinopathy- No.  Last DEE was in Feb 2015 per patient.  Neuropathy- no numbness and tingling in his feet. No concerns with feet. Associated history - No history of CAD or prior stroke or hyperlipidemia. No hypothyroidism. his last set of lipids were-  Lab Results  Component Value Date   CHOL 185 03/31/2012   HDL 40.60 03/31/2012   LDLCALC 115* 03/31/2012   TRIG 146.0 03/31/2012   CHOLHDL 5 03/31/2012   his last TSH was  Lab Results  Component Value Date   TSH 0.760 10/04/2013    Past Medical History  Diagnosis Date  . Diabetes mellitus    Past Surgical History  Procedure Laterality Date  . Irrigation and debridement abscess Right 10/05/2013    Procedure: IRRIGATION AND DEBRIDEMENT RIGHT CHEST WALL ABCESS;  Surgeon: Pedro Earls, MD;  Location: [facility];  Service: General;  Laterality: Right;  . Shoulder arthroscopy with rotator cuff repair    . Ganglion cyst excision  1993    wrist   History   Social History  . Marital Status: Legally Separated    Spouse Name: Vivien Rota    Number of Children: 7  . Years of Education: N/A   Occupational History  .  [employer]   Social History Main Topics  . Smoking status: Never Smoker   . Smokeless tobacco: Never Used  . Alcohol Use: Yes     Comment: socially  . Drug Use: No  . Sexual Activity: Yes   Other Topics Concern  . Not on file   Social History Narrative  . No narrative on file   Family History  Problem Relation Age of Onset  . Hypertension Father    Current  Outpatient Prescriptions on File Prior to Visit  Medication Sig Dispense Refill  . Blood Glucose Monitoring Suppl (ONE TOUCH ULTRA SYSTEM KIT) W/DEVICE KIT 1 kit by Does not apply route once.  30 each  0  . JANUVIA 100 MG tablet TAKE 1 TABLET (100 MG TOTAL) BY MOUTH DAILY.  30 tablet  3  . metFORMIN (GLUCOPHAGE) 1000 MG tablet Take 1,000 mg by mouth 2 (two) times daily with a meal.       No current facility-administered medications on file prior to visit.   No Known Allergies   REVIEW OF SYSTEMS- Review of Systems  Constitutional: Positive for malaise/fatigue. Negative for fever, chills and weight loss.  HENT: Negative for ear discharge, ear pain, hearing loss and sore throat.   Eyes: Negative for blurred vision, double vision and pain.  Respiratory: Negative for cough, shortness of breath and wheezing.   Cardiovascular: Negative for chest pain, palpitations and leg swelling.  Gastrointestinal: Negative for nausea, vomiting, abdominal pain, diarrhea and constipation.  Genitourinary: Positive for frequency. Negative for dysuria.  Musculoskeletal: Negative for back pain, joint pain and myalgias.  Skin: Negative for itching and rash.  Neurological: Negative for dizziness, tingling, sensory change, focal weakness, seizures, loss of consciousness, weakness and headaches.  Endo/Heme/Allergies: Positive for polydipsia. Does not bruise/bleed easily.  Psychiatric/Behavioral: The patient is not nervous/anxious.     PHYSICAL EXAM- BP 110/70  Pulse 74  Temp(Src) 98.7 F (37.1 C) (Oral)  Ht 5' 11"  (1.803 m)  Wt 209 lb 4 oz (94.915 kg)  BMI 29.20 kg/m2 Wt Readings from Last 3 Encounters:  05/10/14 209 lb 4 oz (94.915 kg)  12/18/13 202 lb 8 oz (91.853 kg)  10/19/13 209 lb 3.2 oz (94.892 kg)   GENERAL: No acute distress HEENT:  Eye exam shows normal external appearance. Oral exam shows normal mucosa .  NECK:   Neck exam shows no lymphadenopathy. No Carotids bruits. Thyroid is not enlarged  and no nodules felt.   LUNGS:         Chest is symmetrical. Lungs are clear to auscultation.Marland Kitchen   HEART:         Heart sounds:  S1 and S2 are normal. No murmurs or clicks heard. ABDOMEN:  No Distention present. Liver and spleen are not palpable. No other mass or tenderness present.  EXTREMITIES:     There is no edema. No skin lesions present.Marland Kitchen  NEUROLOGICAL:     Grossly intact. 2+ reflexes at biceps bilaterally.             Diabetic foot exam done with shoes and socks removed: Normal Monofilament testing bilaterally. No deformity of Toes. Some calluses/callosity over left sole.  Nails not Dystrophic.  MUSCULOSKELETAL:       There is  no gross enlargement or deformity of the joints.  SKIN:       No rash, lesions   , + acanthosis nigricans     ASSESSMENT/PLAN- 1. Type 2 Diabetes Mellitus without complications, uncontrolled.   Patient Instructions  Check sugars twice daily. Goal sugars for now are under 180 for now.  Continue metformin at 1000 mg twice daily ( morning and night time). Labs today.  Continue Januvia 100 mg daily. Increase Glipizide to 5 mg twice daily. Start lantus insulin at 10 units at bedtime. Increase by 2 units every 3 days till morning sugars are around 130.  Follow up with dietician appt   Discussed that he is probably experiencing relative hypoglycemia and feels symptomatic even when his sugars are in the normal range given his elevated A1c. As his A1c gets better over the next several months could consider alternate non insulin oral/injectable options as well.  Will update labs to assess his etiology of his diabetes given his young age.     - Return to clinic in 2 weeks with meter  Discussed etiology of type 2 diabetes mellitus, lifestyle management, risk of long term complications with uncontrolled sugars, various treatment options for management of sugars. Spent 55 minutes with the patient, > 50% time spent on counselling and discussion of topics mentioned above.     Luke Abbott 05/10/2014, 2:15 PM

## 2014-05-10 NOTE — Progress Notes (Signed)
Pre visit review using our clinic review tool, if applicable. No additional management support is needed unless otherwise documented below in the visit note. 

## 2014-05-10 NOTE — Patient Instructions (Signed)
Check sugars twice daily. Goal sugars for now are under 180 for now.  Continue metformin at 1000 mg twice daily ( morning and night time). Labs today.  Continue Januvia 100 mg daily. Increase Glipizide to 5 mg twice daily. Start lantus insulin at 10 units at bedtime. Increase by 2 units every 3 days till morning sugars are around 130.  Follow up with dietician appt

## 2014-05-11 LAB — GAD-65 AUTOANTIBODY

## 2014-05-15 LAB — ANTI-ISLET CELL ANTIBODY

## 2014-05-16 ENCOUNTER — Encounter: Payer: Self-pay | Admitting: *Deleted

## 2014-05-24 ENCOUNTER — Ambulatory Visit (INDEPENDENT_AMBULATORY_CARE_PROVIDER_SITE_OTHER): Payer: Managed Care, Other (non HMO) | Admitting: Endocrinology

## 2014-05-24 ENCOUNTER — Encounter: Payer: Self-pay | Admitting: Endocrinology

## 2014-05-24 VITALS — BP 110/72 | HR 86 | Ht 71.0 in | Wt 214.0 lb

## 2014-05-24 DIAGNOSIS — E119 Type 2 diabetes mellitus without complications: Secondary | ICD-10-CM

## 2014-05-24 MED ORDER — INSULIN GLARGINE 100 UNIT/ML SOLOSTAR PEN: PEN_INJECTOR | SUBCUTANEOUS | Status: DC

## 2014-05-24 NOTE — Patient Instructions (Signed)
  Sugars are improving! Please continue current oral medications for diabetes.  Increase lantus to 14 units daily. Thereafter increase it by 2 units every 3 days till morning sugars are around 130.  Check sugars 2 x daily ( before breakfast and bedtime).  Record them in a log book and bring that/meter to next appointment.  [facility]   Keep watch on your lightheadedness. If symptom persists or you feel worse then visit PCP for further evaluation.   Please come back for a follow-up appointment in 6 weeks

## 2014-05-24 NOTE — Progress Notes (Signed)
REASON FOR VISIT- Luke Abbott is a 38 y.o.-year-old male, here for follow up management of Type 2 Diabetes Mellitus, uncontrolled, without complications ( Dx 6734, currently on combination or orals and basal insulin). Last seen by me 2 weeks ago.   HPI-  He has been fair since the last visit here. He is taking Metformin 1000 mg twice daily ( morning and night time), Glipizide 5 mg twice daily, Januvia 176m daily along with lantus insulin at 10 Units daily at qhs. He has been taking these medications as directed and reports compliance. He is here with his fiance and 3 month son.    Patient reports that yesterday at work the temperature was very hot and after 2-3 hours at work, he started feeling nausea, feeling unwell and developed a tingling right mid abdomen pain as though " he was hungry". Was staying hydrated. Denies hypoglycemia. Was associated with some Lightheadedness and these symptoms have resolved now, except for minor LH today. ROS is detailed below.     His most recent  A1cs were recorded at  Lab Results  Component Value Date   HGBA1C 11.4* 05/10/2014   HGBA1C 12.2* 12/18/2013   HGBA1C 11.5* 10/03/2013     Patient does checks his sugars  Regularly now for the past week. Checks  Daily at qhs mostly. One touch ultra meter.  By review, his sugars are- PREMEAL Breakfast Lunch Dinner Bedtime Overall  Glucose range: 173-265 221-260  245-161   Mean/median:         Hypoglycemia- No recent lows or symptoms of low blood sugars.   Dietary Habits- Eats two-three times daily. Limits Carbohydrate- using baked foods recently. [facility] hasnt been able to go there due to work schedule. Limits  to diet Sodas/ limits sweetened beverages. Exercise- Walks daily at work. 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 Reports that these symptoms have improved in the past week.    Weight- fluctuates , recently  Is up by 5 lbs in the last 2 weeks.     Diabetes Complications- No known complications Nephropathy-- No  CKD, last BUN/creatinine were in normal range recently. Has positive urine MA in setting of higher sugars- will retest at next visit-  Lab Results  Component Value Date   MICROALBUR 2.7* 12/18/2013   LDLCALC 115* 03/31/2012   CREATININE 0.8 05/10/2014   Retinopathy- No.  Last DEE was in Feb 2015 per patient.  Neuropathy- no numbness and tingling in his feet. No concerns with feet. Associated history - No history of CAD or prior stroke or hyperlipidemia. No hypothyroidism. his last set of lipids were-  Lab Results  Component Value Date   CHOL 185 03/31/2012   HDL 40.60 03/31/2012   LDLCALC 115* 03/31/2012   TRIG 146.0 03/31/2012   CHOLHDL 5 03/31/2012   his last TSH was  Lab Results  Component Value Date   TSH 0.45 05/10/2014    I have reviewed the patient's past medical history,  medications and allergies.   Current Outpatient Prescriptions on File Prior to Visit  Medication Sig Dispense Refill  . Blood Glucose Monitoring Suppl (ONE TOUCH ULTRA SYSTEM KIT) W/DEVICE KIT 1 kit by Does not apply route once.  30 each  0  . glipiZIDE (GLUCOTROL) 5 MG tablet Take 1 tablet (5 mg total) by mouth 2 (two) times daily before a meal.  60 tablet  3  . glucose blood (ONE TOUCH ULTRA TEST) test strip 1 each by Other route 2 (two) times daily. Use as instructed  50 each  12  . Insulin Glargine (LANTUS SOLOSTAR) 100 UNIT/ML Solostar Pen Inject 10 units [place] daily at bedtime. Increase by 2 units every 3 days till morning sugars are are 130.  5 pen  3  . Insulin Pen Needle (BD PEN NEEDLE NANO U/F) 32G X 4 MM MISC Use once daily for insulin administration  30 each  6  . JANUVIA 100 MG tablet TAKE 1 TABLET (100 MG TOTAL) BY MOUTH DAILY.  30 tablet  3  . metFORMIN (GLUCOPHAGE) 1000 MG tablet Take 1,000 mg by mouth 2 (two) times daily with a meal.       No current  facility-administered medications on file prior to visit.   No Known Allergies   REVIEW OF SYSTEMS- Review of Systems  Constitutional: Negative for fever, chills, weight loss and malaise/fatigue.  Eyes: Negative for blurred vision.  Respiratory: Negative for cough and shortness of breath.   Cardiovascular: Positive for leg swelling. Negative for chest pain.  Gastrointestinal: Positive for nausea and abdominal pain. Negative for vomiting, diarrhea and constipation.  Genitourinary: Negative for frequency.  Neurological: Negative for tingling and sensory change.    PHYSICAL EXAM- BP 110/72  Pulse 86  Ht 5' 11"  (1.803 m)  Wt 214 lb (97.07 kg)  BMI 29.86 kg/m2  SpO2 97% Wt Readings from Last 3 Encounters:  05/24/14 214 lb (97.07 kg)  05/10/14 209 lb 4 oz (94.915 kg)  12/18/13 202 lb 8 oz (91.853 kg)   GENERAL: No acute distress HEENT:  Eye exam shows normal external appearance.  LUNGS:         Chest is symmetrical. Lungs are clear to auscultation.Marland Kitchen   HEART:         Heart sounds:  S1 and S2 are normal. No murmurs or clicks heard. ABDOMEN:  No Distention present. Liver and spleen are not palpable. No other mass or tenderness present.  EXTREMITIES:     There is no edema.   No visits with results within 2 Week(s) from this visit. Latest known visit with results is:  Office Visit on 05/10/2014  Component Date Value Ref Range Status  . Hemoglobin A1C 05/10/2014 11.4* 4.6 - 6.5 % Final   Glycemic Control Guidelines for People with Diabetes:Non Diabetic:  <6%Goal of Therapy: <7%Additional Action Suggested:  >8%   . Sodium 05/10/2014 131* 135 - 145 mEq/L Final  . Potassium 05/10/2014 4.0  3.5 - 5.1 mEq/L Final  . Chloride 05/10/2014 101  96 - 112 mEq/L Final  . CO2 05/10/2014 20  19 - 32 mEq/L Final  . Glucose, Bld 05/10/2014 322* 70 - 99 mg/dL Final  . BUN 05/10/2014 11  6 - 23 mg/dL Final  . Creatinine, Ser 05/10/2014 0.8  0.4 - 1.5 mg/dL Final  . Total Bilirubin 05/10/2014 0.4   0.2 - 1.2 mg/dL Final  . Alkaline Phosphatase 05/10/2014 93  39 - 117 U/L Final  . AST 05/10/2014 14  0 - 37 U/L Final  . ALT 05/10/2014 16  0 - 53 U/L Final  . Total Protein 05/10/2014 7.3  6.0 - 8.3 g/dL Final  . Albumin 05/10/2014 3.7  3.5 - 5.2 g/dL Final  . Calcium 05/10/2014 9.2  8.4 - 10.5 mg/dL Final  . GFR 05/10/2014 132.88  >60.00 mL/min Final  . TSH 05/10/2014 0.45  0.35 - 4.50 uIU/mL Final  .  Glutamic Acid Decarb Ab 05/10/2014 <1.0  0.0 - 1.5 U/mL Final  . Pancreatic Islet Cell Antibody 05/10/2014 <5  < 5 JDF Units Final   Comment:                             Laboratory Developed Test performed using a reagent labeled by                           the manufacturer as ASR Class I or ASR Class II (non-blood bank).                                                      This test(s) was developed and its performance characteristics                           have been determined by [employer],                           [place], [place]. It has not been cleared or approved by the U.S.                           Food and Drug Administration. The FDA has determined that such                           clearance or approval is not necessary. Performance                           characteristics refer to the analytical performance of the test.   ASSESSMENT/PLAN- 1. Type 2 Diabetes Mellitus without complications, uncontrolled.  He wants to see if he could [facility] a dietic[facility] at [facility] and will refer him to [facility] for dietary education.  Not sure whether LH is related to his work environment yesterday, but patient better today. Remainder plan below-  Patient Instructions   Sugars are improving! Please continue current oral medications for diabetes.  Increase lantus to 14 units daily. Thereafter increase it by 2 units every 3 days till morning sugars are around 130.  Check sugars 2 x daily ( before breakfast and bedtime).  Record them in a log book and bring that/meter to next  appointment.  Refer to DM education at [facility].   Keep watch on your lightheadedness. If symptom persists or you feel worse then visit PCP for further evaluation.   Please come back for a follow-up appointment in 6 weeks   Labs to be done at visit- Lipids ( if fasting) and urine MA.      Mathew Storck PUSHKAR 05/24/2014, 10:52 AM

## 2014-05-24 NOTE — Progress Notes (Signed)
Pre visit review using our clinic review tool, if applicable. No additional management support is needed unless otherwise documented below in the visit note. 

## 2014-05-25 ENCOUNTER — Ambulatory Visit (INDEPENDENT_AMBULATORY_CARE_PROVIDER_SITE_OTHER): Payer: Managed Care, Other (non HMO) | Admitting: Family Medicine

## 2014-05-25 ENCOUNTER — Encounter: Payer: Self-pay | Admitting: Family Medicine

## 2014-05-25 VITALS — BP 138/70 | HR 80 | Temp 98.4°F | Wt 215.0 lb

## 2014-05-25 DIAGNOSIS — R55 Syncope and collapse: Secondary | ICD-10-CM | POA: Insufficient documentation

## 2014-05-25 DIAGNOSIS — E119 Type 2 diabetes mellitus without complications: Secondary | ICD-10-CM

## 2014-05-25 NOTE — Assessment & Plan Note (Signed)
Likely due to being in the heat and his body adjusting to normalizing blood sugars- had been elevated for prolonged period of time. He is staying hydrated at work.  Advised him to continue doing this. Note given to stay out of work until Monday. Call or return to clinic prn if these symptoms worsen or fail to improve as anticipated. The patient indicates understanding of these issues and agrees with the plan.

## 2014-05-25 NOTE — Progress Notes (Signed)
Subjective:    Patient ID: Luke Abbott, male    DOB: 1976/09/05, 38 y.o.   MRN: [id number]  HPI  Very pleasant 38 yo male with h/o DM here for pre- sycnope.  DM- diagnosed in 2006.  Has been poorly controlled.  Referred to endocrinology. Restarted Lantus on 7/9. Last saw endocrinology yesterday.  Blood sugars improving, remaining below 200.  No episodes of hypoglycemia.  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  Two days ago, standing at work and felt very dizzy, "clamy."  Saw "spots" and thought he would pass out but did not. Denies CP or SOB.  No recurrent episodes.  He is taking Metformin 1000 mg twice daily ( morning and night time), Glipizide 5 mg twice daily, Januvia 173m daily along with lantus insulin at 10 Units daily at qhs   Lab Results  Component Value Date   HGBA1C 11.4* 05/10/2014  t.   Wt Readings from Last 3 Encounters:  05/25/14 215 lb (97.523 kg)  05/24/14 214 lb (97.07 kg)  05/10/14 209 lb 4 oz (94.915 kg)    Patient Active Problem List   Diagnosis Date Noted  . Pre-syncope 05/25/2014  . Diabetes mellitus    Past Medical History  Diagnosis Date  . Diabetes mellitus    Past Surgical History  Procedure Laterality Date  . Irrigation and debridement abscess Right 10/05/2013    Procedure: IRRIGATION AND DEBRIDEMENT RIGHT CHEST WALL ABCESS;  Surgeon: MPedro Earls MD;  Location: [facility];  Service: General;  Laterality: Right;  . Shoulder arthroscopy with rotator cuff repair    . Ganglion cyst excision  1993    wrist   History  Substance Use Topics  . Smoking status: Never Smoker   . Smokeless tobacco: Never Used  . Alcohol Use: Yes     Comment: socially   Family History  Problem Relation Age of Onset  . Hypertension Father   . Kidney disease Mother   . Diabetes Mother    No Known Allergies Current Outpatient Prescriptions on File Prior to Visit  Medication Sig Dispense Refill  . Blood Glucose Monitoring Suppl (ONE TOUCH ULTRA SYSTEM  KIT) W/DEVICE KIT 1 kit by Does not apply route once.  30 each  0  . glipiZIDE (GLUCOTROL) 5 MG tablet Take 1 tablet (5 mg total) by mouth 2 (two) times daily before a meal.  60 tablet  3  . glucose blood (ONE TOUCH ULTRA TEST) test strip 1 each by Other route 2 (two) times daily. Use as instructed  50 each  12  . Insulin Glargine (LANTUS SOLOSTAR) 100 UNIT/ML Solostar Pen Inject 14 units [place] daily at bedtime. Increase by 2 units every 3 days till morning sugars are are 130.  5 pen  3  . Insulin Pen Needle (BD PEN NEEDLE NANO U/F) 32G X 4 MM MISC Use once daily for insulin administration  30 each  6  . JANUVIA 100 MG tablet TAKE 1 TABLET (100 MG TOTAL) BY MOUTH DAILY.  30 tablet  3  . metFORMIN (GLUCOPHAGE) 1000 MG tablet Take 1,000 mg by mouth 2 (two) times daily with a meal.       No current facility-administered medications on file prior to visit.   The PMH, PSH, Social History, Family History, Medications, and [facility] and have been updated if relevant.   Review of Systems See HPI Has had some nausea in the heat    Objective:   Physical Exam  BP 138/70  Pulse 80  Temp(Src) 98.4 F (36.9 C) (Oral)  Wt 215 lb (97.523 kg)  SpO2 98% General:  overweght male in NAD Eyes:  PERRL Ears:  External ear exam shows no significant lesions or deformities.  Otoscopic examination reveals clear canals, tympanic membranes are intact bilaterally without bulging, retraction, inflammation or discharge. Hearing is grossly normal bilaterally. Nose:  External nasal examination shows no deformity or inflammation. Nasal mucosa are pink and moist without lesions or exudates. Mouth:  Oral mucosa and oropharynx without lesions or exudates.  Teeth in good repair. Neck:  no carotid bruit or thyromegaly no cervical or supraclavicular lymphadenopathy  Lungs:  Normal respiratory effort, chest expands symmetrically. Lungs are clear to auscultation, no crackles or wheezes. Heart:  Normal  rate and regular rhythm. S1 and S2 normal without gallop, murmur, click, rub or other extra sounds. Abdomen:  Bowel sounds positive,abdomen soft and non-tender without masses, organomegaly or hernias noted. Pulses:  R and L posterior tibial pulses are full and equal bilaterally      Assessment & Plan:

## 2014-05-25 NOTE — Progress Notes (Signed)
Pre visit review using our clinic review tool, if applicable. No additional management support is needed unless otherwise documented below in the visit note. 

## 2014-06-05 ENCOUNTER — Ambulatory Visit: Payer: Managed Care, Other (non HMO) | Admitting: Nutrition

## 2014-07-05 ENCOUNTER — Encounter: Payer: Self-pay | Admitting: Endocrinology

## 2014-07-05 ENCOUNTER — Ambulatory Visit (INDEPENDENT_AMBULATORY_CARE_PROVIDER_SITE_OTHER): Payer: Managed Care, Other (non HMO) | Admitting: Endocrinology

## 2014-07-05 ENCOUNTER — Ambulatory Visit: Payer: Managed Care, Other (non HMO) | Admitting: Endocrinology

## 2014-07-05 VITALS — BP 117/77 | HR 84 | Wt 221.0 lb

## 2014-07-05 DIAGNOSIS — E119 Type 2 diabetes mellitus without complications: Secondary | ICD-10-CM

## 2014-07-05 NOTE — Patient Instructions (Signed)
Check sugars 2 x daily ( before breakfast and before supper).  Record them in a log book and bring that/meter to next appointment.    Continue current doses of Metformin, Glipizide  Stop januvia  Decrease Lantus to 12 units daily at bedtime.  Report back if problems with persistent high or low sugars.   Please come back 3 days prior to next appointment for fasting labs.   Please come back for a follow-up appointment in 6 weeks.

## 2014-07-05 NOTE — Assessment & Plan Note (Signed)
A1c uncontrolled. Sugars are getting better, now with infrequent hypoglycemia ( probably related to delayed meals/ exertion).  Hypoglycemia recognition and treatment protocol discussed.  Offered to switch Januvia to other DPPIV, however patient would like to stay off this medication for now since sugars are at target mostly.  Continue current metformin and glipizide.  Decrease Lantus to 12 units daily.  He will call to reschedule DM education appt.  Check sugars 2 x daily and when symptoms of hypoglycemia.  RTC 6 weeks. Fasting labs prior to that appointment.

## 2014-07-05 NOTE — Progress Notes (Signed)
Pre visit review using our clinic review tool, if applicable. No additional management support is needed unless otherwise documented below in the visit note. 

## 2014-07-05 NOTE — Progress Notes (Signed)
REASON FOR VISIT- Luke Abbott is a 38 y.o.-year-old male, here for follow up management of Type 2 Diabetes Mellitus, uncontrolled, without complications ( Dxed 3419, is on combination of orals and basal insulin).  Last seen by me about 6 weeks ago.    Currently taking  - Lantus  14 Units at bedtime,  - Metformin 1000 mg twice daily ( morning and night time) - Glipizide  $RemoveBe'5mg'[id number]$  twice daily  ( taking separate from metformin)  Stopped using Januvia 100 mg daily due to scratchy throat and expense of medication, about 3 weeks ago. Reports that sugars are still at target.  Reports compliance to his other DM medications.   Last few hemoglobin A1cs are as follows- Lab Results  Component Value Date   HGBA1C 11.4* 05/10/2014   HGBA1C 12.2* 12/18/2013   HGBA1C 11.5* 10/03/2013     Patient checks his sugars 2-3 times daily with a  One Touch Ultra glucometer.  By meter download his sugars are-  PREMEAL Breakfast Lunch Dinner Bedtime Overall  Glucose range: 89-95 59-123 74-170s 100s   Mean/median:         Hypoglycemia-  Few recent lows. Lowest sugar was 59 prior to lunch, ? trigger; he has hypoglycemia awareness at 70.   Has been working on his diet and reducing carbs in meals/snacks. Eats 2-three times daily. Recd call to reschedule DM education appt and hasn't called back to reschedule.  Walking for exercise at work. Physicist, medical at [employer], [employer].  Weight trending up  Wt Readings from Last 3 Encounters:  07/05/14 221 lb (100.245 kg)  05/25/14 215 lb (97.523 kg)  05/24/14 214 lb (97.07 kg)    Diabetes Complications-  No  CKD, last BUN/creatinine-GFR 132 . Prior positive urine MA in setting of higher sugars, ratio okay.  Lab Results  Component Value Date   BUN 11 05/10/2014   CREATININE 0.8 05/10/2014    Lab Results  Component Value Date   MICROALBUR 2.7* 12/18/2013    Last DEE was in Feb 2015 No numbness and tingling in his feet. No known neuropathy.  No CAD. No history of  prior  stroke.   Hyperlipidemia-  Currently on dietary therapy. Somewhat compliant. his last set of lipids were uncontrolled, but were a while back. Not fasting today-  Lab Results  Component Value Date   CHOL 185 03/31/2012   HDL 40.60 03/31/2012   LDLCALC 115* 03/31/2012   TRIG 146.0 03/31/2012   CHOLHDL 5 03/31/2012    BP -  BP well controlled today.   I have reviewed the patient's past medical history, medications and allergies.   Outpatient Encounter Prescriptions as of 07/05/2014  Medication Sig Note  . Blood Glucose Monitoring Suppl (ONE TOUCH ULTRA SYSTEM KIT) W/DEVICE KIT 1 kit by Does not apply route once.   Marland Kitchen glipiZIDE (GLUCOTROL) 5 MG tablet Take 1 tablet (5 mg total) by mouth 2 (two) times daily before a meal.   . glucose blood (ONE TOUCH ULTRA TEST) test strip 1 each by Other route 2 (two) times daily. Use as instructed   . Insulin Glargine (LANTUS SOLOSTAR) 100 UNIT/ML Solostar Pen Inject 14 units [place] daily at bedtime. Increase by 2 units every 3 days till morning sugars are are 130.   Marland Kitchen Insulin Pen Needle (BD PEN NEEDLE NANO U/F) 32G X 4 MM MISC Use once daily for insulin administration   . metFORMIN (GLUCOPHAGE) 1000 MG tablet Take 1,000 mg by mouth 2 (two) times daily with a  meal.   . [DISCONTINUED] JANUVIA 100 MG tablet TAKE 1 TABLET (100 MG TOTAL) BY MOUTH DAILY. 07/05/2014: scrathy sensation in throat   No Known Allergies  REVIEW OF SYSTEMS- Review of Systems- [ x ]  Complains of    [  ]  denies [ x ] Recent weight change [  ]  Fatigue [ x ] polydipsia [  ] polyuria [  ]  nocturia [  ]  vision difficulty [  ] chest pain [  ] shortness of breath [  ] leg swelling [  ] cough [  ] nausea/vomiting [ x ] diarrhea [  ] constipation [  ] abdominal pain [  ]  tingling/numbness in extremities [  ]  concern with feet ( wounds/sores)   PHYSICAL EXAM- BP 117/77  Pulse 84  Wt 221 lb (100.245 kg) Wt Readings from Last 3 Encounters:  07/05/14 221 lb (100.245 kg)  05/25/14 215 lb  (97.523 kg)  05/24/14 214 lb (97.07 kg)    Exam: deferred     ASSESSMENT/PLAN- Problem List Items Addressed This Visit     Endocrine   Diabetes mellitus - Primary     A1c uncontrolled. Sugars are getting better, now with infrequent hypoglycemia ( probably related to delayed meals/ exertion).  Hypoglycemia recognition and treatment protocol discussed.  Offered to switch Januvia to other DPPIV, however patient would like to stay off this medication for now since sugars are at target mostly.  Continue current metformin and glipizide.  Decrease Lantus to 12 units daily.  He will call to reschedule DM education appt.  Check sugars 2 x daily and when symptoms of hypoglycemia.  RTC 6 weeks. Fasting labs prior to that appointment.     Relevant Orders      Comprehensive metabolic panel      Hemoglobin A1c      Microalbumin / creatinine urine ratio      Lipid Profile      - RTC 6 weeks     Kingslee Dowse PUSHKAR 07/05/2014, 3:33 PM

## 2014-07-10 ENCOUNTER — Other Ambulatory Visit: Payer: Self-pay | Admitting: Endocrinology

## 2014-07-10 ENCOUNTER — Other Ambulatory Visit: Payer: Self-pay | Admitting: Family Medicine

## 2014-08-20 ENCOUNTER — Other Ambulatory Visit (INDEPENDENT_AMBULATORY_CARE_PROVIDER_SITE_OTHER): Payer: Managed Care, Other (non HMO)

## 2014-08-20 DIAGNOSIS — E119 Type 2 diabetes mellitus without complications: Secondary | ICD-10-CM

## 2014-08-20 LAB — MICROALBUMIN / CREATININE URINE RATIO
CREATININE, U: 212 mg/dL
MICROALB UR: 4.4 mg/dL — AB (ref 0.0–1.9)
Microalb Creat Ratio: 2.1 mg/g (ref 0.0–30.0)

## 2014-08-20 LAB — COMPREHENSIVE METABOLIC PANEL
ALK PHOS: 83 U/L (ref 39–117)
ALT: 17 U/L (ref 0–53)
AST: 15 U/L (ref 0–37)
Albumin: 3.4 g/dL — ABNORMAL LOW (ref 3.5–5.2)
BUN: 12 mg/dL (ref 6–23)
CO2: 29 mEq/L (ref 19–32)
CREATININE: 0.8 mg/dL (ref 0.4–1.5)
Calcium: 8.8 mg/dL (ref 8.4–10.5)
Chloride: 102 mEq/L (ref 96–112)
GFR: 132.69 mL/min (ref >60.00)
GLUCOSE: 150 mg/dL — AB (ref 70–99)
Potassium: 4 mEq/L (ref 3.5–5.1)
Sodium: 138 mEq/L (ref 135–145)
Total Bilirubin: 0.6 mg/dL (ref 0.2–1.2)
Total Protein: 7.9 g/dL (ref 6.0–8.3)

## 2014-08-20 LAB — LIPID PANEL
CHOL/HDL RATIO: 6
Cholesterol: 162 mg/dL (ref 0–200)
HDL: 25.8 mg/dL — ABNORMAL LOW (ref >39.00)
LDL CALC: 128 mg/dL — AB (ref 0–99)
NonHDL: 136.2
TRIGLYCERIDES: 42 mg/dL (ref 0.0–149.0)
VLDL: 8.4 mg/dL (ref 0.0–40.0)

## 2014-08-20 LAB — HEMOGLOBIN A1C: HEMOGLOBIN A1C: 7.3 % — AB (ref 4.6–6.5)

## 2014-08-21 ENCOUNTER — Encounter: Payer: Self-pay | Admitting: Endocrinology

## 2014-08-21 ENCOUNTER — Ambulatory Visit (INDEPENDENT_AMBULATORY_CARE_PROVIDER_SITE_OTHER): Payer: Managed Care, Other (non HMO) | Admitting: Endocrinology

## 2014-08-21 VITALS — BP 126/78 | HR 82

## 2014-08-21 DIAGNOSIS — E119 Type 2 diabetes mellitus without complications: Secondary | ICD-10-CM

## 2014-08-21 DIAGNOSIS — E785 Hyperlipidemia, unspecified: Secondary | ICD-10-CM

## 2014-08-21 MED ORDER — LIRAGLUTIDE 18 MG/3ML ~~LOC~~ SOPN: PEN_INJECTOR | SUBCUTANEOUS | Status: DC

## 2014-08-21 NOTE — Assessment & Plan Note (Signed)
A1c is much better and closer to target. Congratulated on effort!  Discussed about continuing metformin and glipizide.  Recent weight gain likely from better sugar control.   At this time, could consider GLP-1/SGLT2 , and pros and cons discussed. Concerned about polyuria with SGLT-2 and he has elected to try Victoza.  Start at 0.6 mg [place] daily and in 1 week increase to 1.2 mg [place] daily.   He is aware that while Victoza is starting to work, he might notice some high sugars. If this persists beyond a month, then he is aware to notify me.  In the interim, stop lantus now.   RTC 3 months

## 2014-08-21 NOTE — Assessment & Plan Note (Signed)
Recent LDL not at goal. Asked him to lower fat content in meals and recheck in 3 -6 months. If LDL is still elevated then will consider statin therapy.

## 2014-08-21 NOTE — Progress Notes (Signed)
Pre visit review using our clinic review tool, if applicable. No additional management support is needed unless otherwise documented below in the visit note. 

## 2014-08-21 NOTE — Patient Instructions (Signed)
Check sugars twice daily.   Continue current doses of metformin and glipizide  Stop lantus  Start  Victoza at 0.6 mg [place] daily and in 1 week increase to 1.2 mg daily. Report back if do not tolerate it due to nausea, vomiting, abdominal pain.   Report back if problems with persistent high or low sugars. It might take a few weeks for the sugars to start becoming controlled again - if they dont improve by about 1 month- please let me know so that we could do an interim lower dose of lantus again.   Goal sugars are in the 90-140.   Please come back for a follow-up appointment in 3 months

## 2014-08-21 NOTE — Progress Notes (Signed)
REASON FOR VISIT- Luke Abbott is a 38 y.o.-year-old male, here for follow up management of Type 2 Diabetes Mellitus, uncontrolled, without complications ( Dxed 1610, is on combination of orals and basal insulin).  Last seen by me about 6 weeks ago.    Currently taking  - Lantus  12 Units at bedtime,  - Metformin 1000 mg twice daily ( morning and night time) - Glipizide  41m twice daily    - Stopped using Januvia 100 mg daily due to scratchy throat and expense of medication, around August 2015.   Last few hemoglobin A1cs are as follows- Lab Results  Component Value Date   HGBA1C 7.3* 08/20/2014   HGBA1C 11.4* 05/10/2014   HGBA1C 12.2* 12/18/2013     Patient checks his sugars 2-3 times daily with a  One Touch Ultra glucometer.  By recall  his sugars are-  PREMEAL Breakfast Lunch Dinner Bedtime Overall  Glucose range: 70-130 <180 220 when he ate "bad"    Mean/median:         Hypoglycemia-  Few recent lows. Lowest sugar was 70 in the morning- now working first shift and had prolonged fasting prior.   ; he has hypoglycemia awareness at 70.   Has been working on his diet and reducing carbs in meals/snacks. Eats 2-three times daily. Recd call to reschedule DM education appt and hasn't called back to reschedule.  Walking for exercise at work. 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 Prior positive urine MA in setting of higher sugars, ratio okay.  Lab Results  Component Value Date   BUN 12 08/20/2014   CREATININE 0.8 08/20/2014    Lab Results  Component Value Date   GFR 132.69 08/20/2014    Lab Results  Component Value Date   MICROALBUR 4.4* 08/20/2014   Lab Results  Component Value Date   MICRALBCREAT 2.1 08/20/2014     Last DEE was in Feb 2015 No numbness and  tingling in his feet. No known neuropathy.  No CAD. No history of  prior stroke.   Hyperlipidemia-  Currently on dietary therapy. Somewhat compliant. his last set of lipids were uncontrolled recently- has been having a lot of butter on his popcorn recently  Lab Results  Component Value Date   CHOL 162 08/20/2014   HDL 25.80* 08/20/2014   LDLCALC 128* 08/20/2014   TRIG 42.0 08/20/2014   CHOLHDL 6 08/20/2014    BP -  BP well controlled today.   I have reviewed the patient's past medical history, medications and allergies.   Outpatient Encounter Prescriptions as of 08/21/2014  Medication Sig  . Blood Glucose Monitoring Suppl (ONE TOUCH ULTRA SYSTEM KIT) W/DEVICE KIT 1 kit by Does not apply route once.  .Marland KitchenglipiZIDE (GLUCOTROL) 5 MG tablet Take 1 tablet (5 mg total) by mouth 2 (two) times daily before a meal.  . glucose blood (ONE TOUCH ULTRA TEST) test strip 1 each by Other route 2 (two) times daily. Use as instructed  . Insulin Pen Needle (BD PEN NEEDLE NANO U/F) 32G X 4 MM MISC Use once daily for insulin administration  . metFORMIN (GLUCOPHAGE) 1000 MG tablet TAKE 1 TABLET BY MOUTH TWICE A DAY  . [DISCONTINUED] Insulin Glargine (LANTUS SOLOSTAR) 100 UNIT/ML Solostar Pen Inject  14 units [place] daily at bedtime. Increase by 2 units every 3 days till morning sugars are are 130.  . Liraglutide 18 MG/3ML SOPN Inject 0.6 mg [place] daily and in 1 week increase that to 1.2 mg [place] daily   No Known Allergies  REVIEW OF SYSTEMS- Review of Systems- [ x ]  Complains of    [  ]  denies [ x ] Recent weight change [  ]  Fatigue [  ] polydipsia [  ] polyuria [  ]  nocturia [  ]  vision difficulty [  ] chest pain [  ] shortness of breath [  ] leg swelling [  ] cough [  ] nausea/vomiting [  ] diarrhea [  ] constipation [  ] abdominal pain [  ]  tingling/numbness in extremities [  ]  concern with feet ( wounds/sores)   PHYSICAL EXAM- BP 126/78  Pulse 82  SpO2 97% Wt Readings from Last 3 Encounters:   07/05/14 221 lb (100.245 kg)  05/25/14 215 lb (97.523 kg)  05/24/14 214 lb (97.07 kg)    Exam: deferred     ASSESSMENT/PLAN- Problem List Items Addressed This Visit     Endocrine   Diabetes mellitus - Primary     A1c is much better and closer to target. Congratulated on effort!  Discussed about continuing metformin and glipizide.  Recent weight gain likely from better sugar control.   At this time, could consider GLP-1/SGLT2 , and pros and cons discussed. Concerned about polyuria with SGLT-2 and he has elected to try Victoza.  Start at 0.6 mg [place] daily and in 1 week increase to 1.2 mg [place] daily.   He is aware that while Victoza is starting to work, he might notice some high sugars. If this persists beyond a month, then he is aware to notify me.  In the interim, stop lantus now.   RTC 3 months    Relevant Medications      Liraglutide 18 MG/3ML SOPN     Other   Hyperlipidemia     Recent LDL not at goal. Asked him to lower fat content in meals and recheck in 3 -6 months. If LDL is still elevated then will consider statin therapy.         - RTC 3 months. Recent labs done by me were discussed with patient in clinic.  25 minutes spent with the patient, >50% time spent on discussion of topics mentioned above.      Jesseka Drinkard PUSHKAR 08/21/2014, 4:10 PM

## 2014-08-30 ENCOUNTER — Telehealth: Payer: Self-pay | Admitting: Endocrinology

## 2014-08-30 NOTE — Telephone Encounter (Signed)
Pt called clinic reporting that he has started the Victoza last week. Since past 3 days, he has increased dose to 1.2 mg daily and noted increased nausea, had vomiting for 2 days, and has been sneezing a lot, sometimes associated with a fleeting pain on sneezing in his chest, no abdominal pain. Eating okay now. But has dizziness. Now vomiting resolved.  FS are in the 80-120 range, with a recent low of 58.  Asked him to decrease Glipizide to 5 mg daily.  Cut back Victoza to 0.6 mg [place] daily and see if symptoms improve on this dose for the next 2 weeks. Then if tolerated increase the dose.  Asked patient to stay hydrated.  If continued symptoms, then call back and would need appointment to be seen.

## 2014-09-02 IMAGING — CR DG HAND COMPLETE 3+V*R*
3 series · 3 of 3 positions shown · non-contrast
Comparison: 12/07/2007

CLINICAL DATA: Injury.

RIGHT HAND - COMPLETE 3+ VIEW

[view not recorded (1 of 3)]
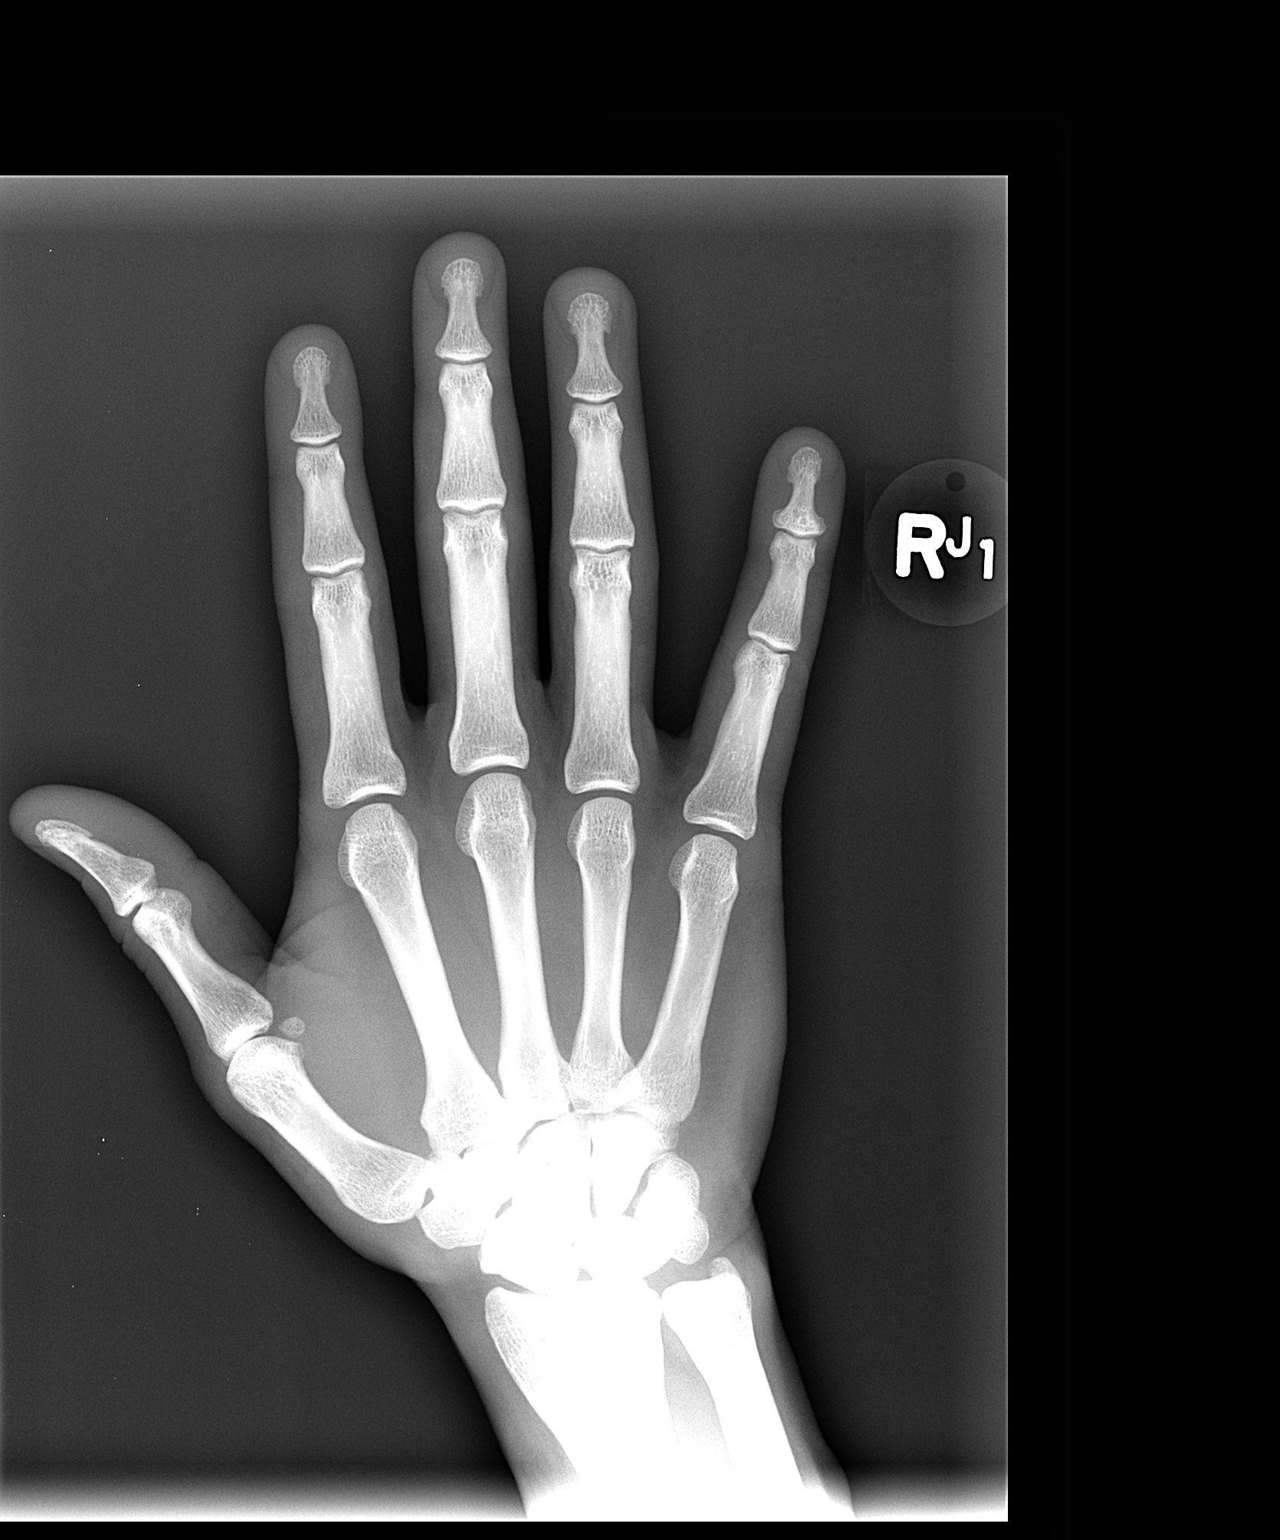

[view not recorded (2 of 3)]
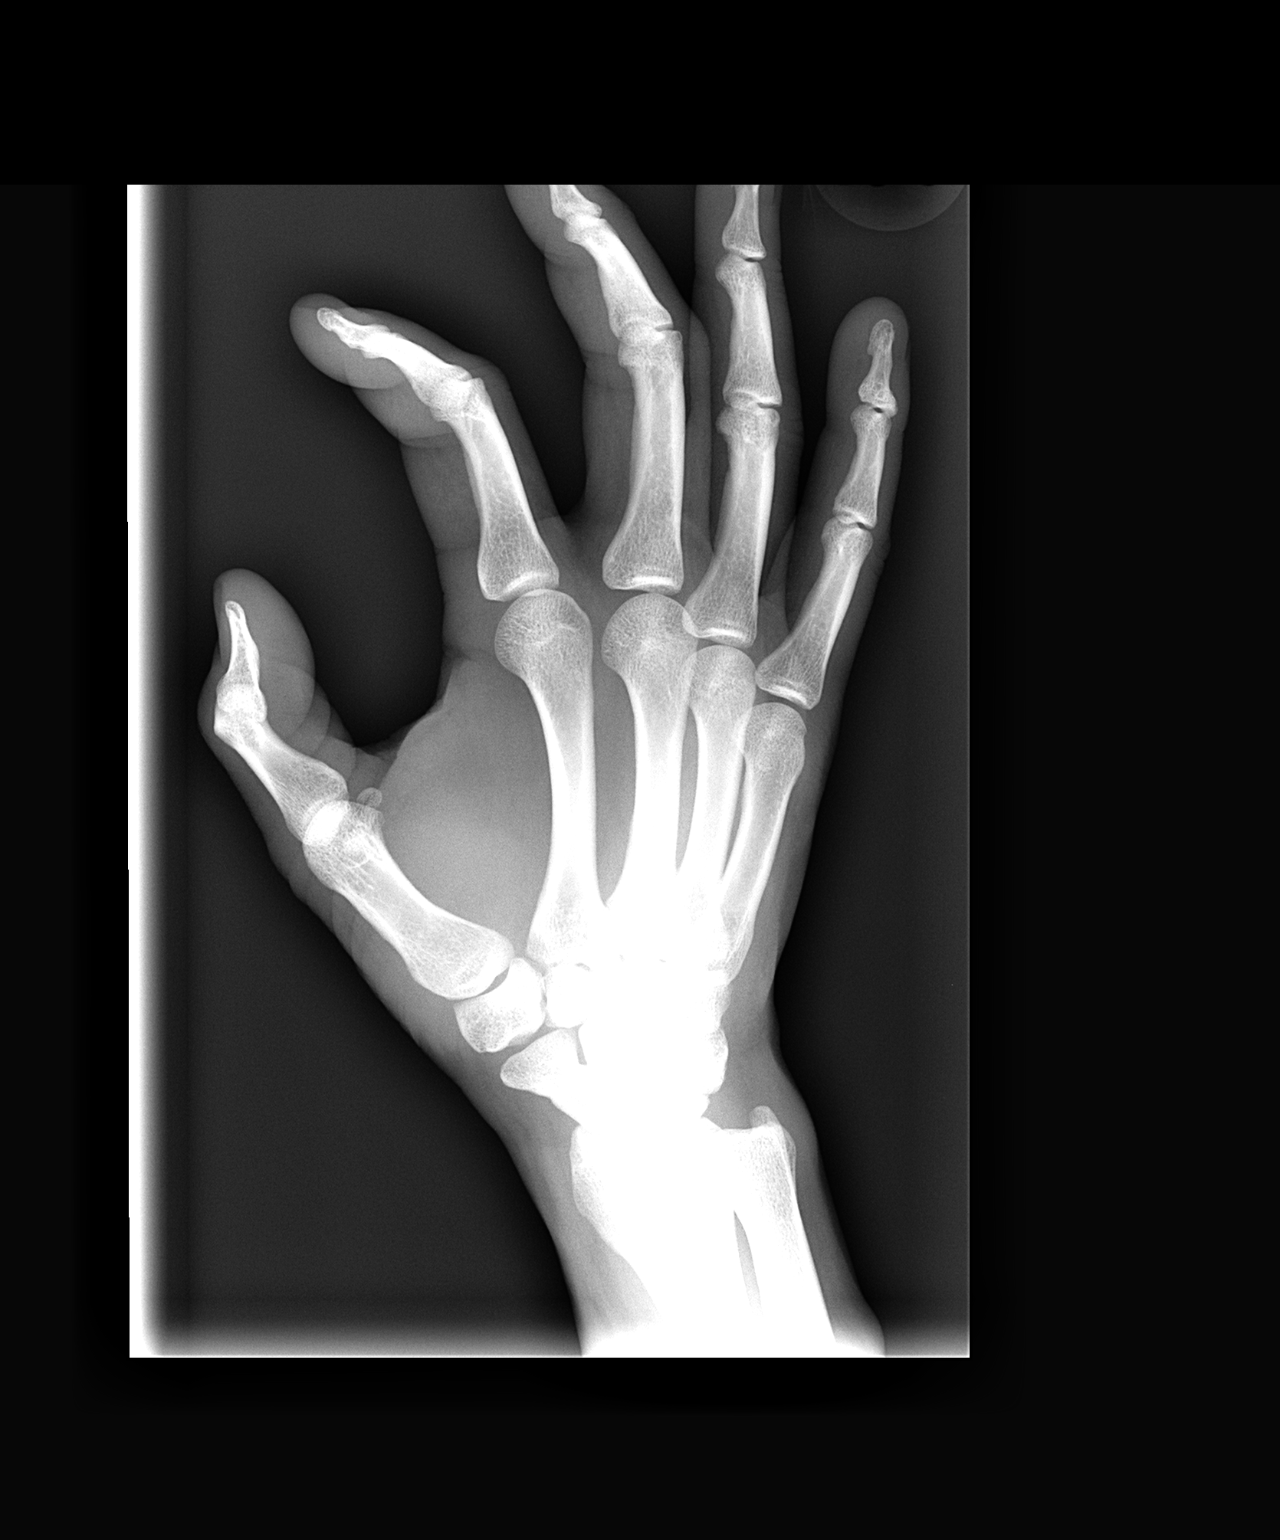

[view not recorded (3 of 3)]
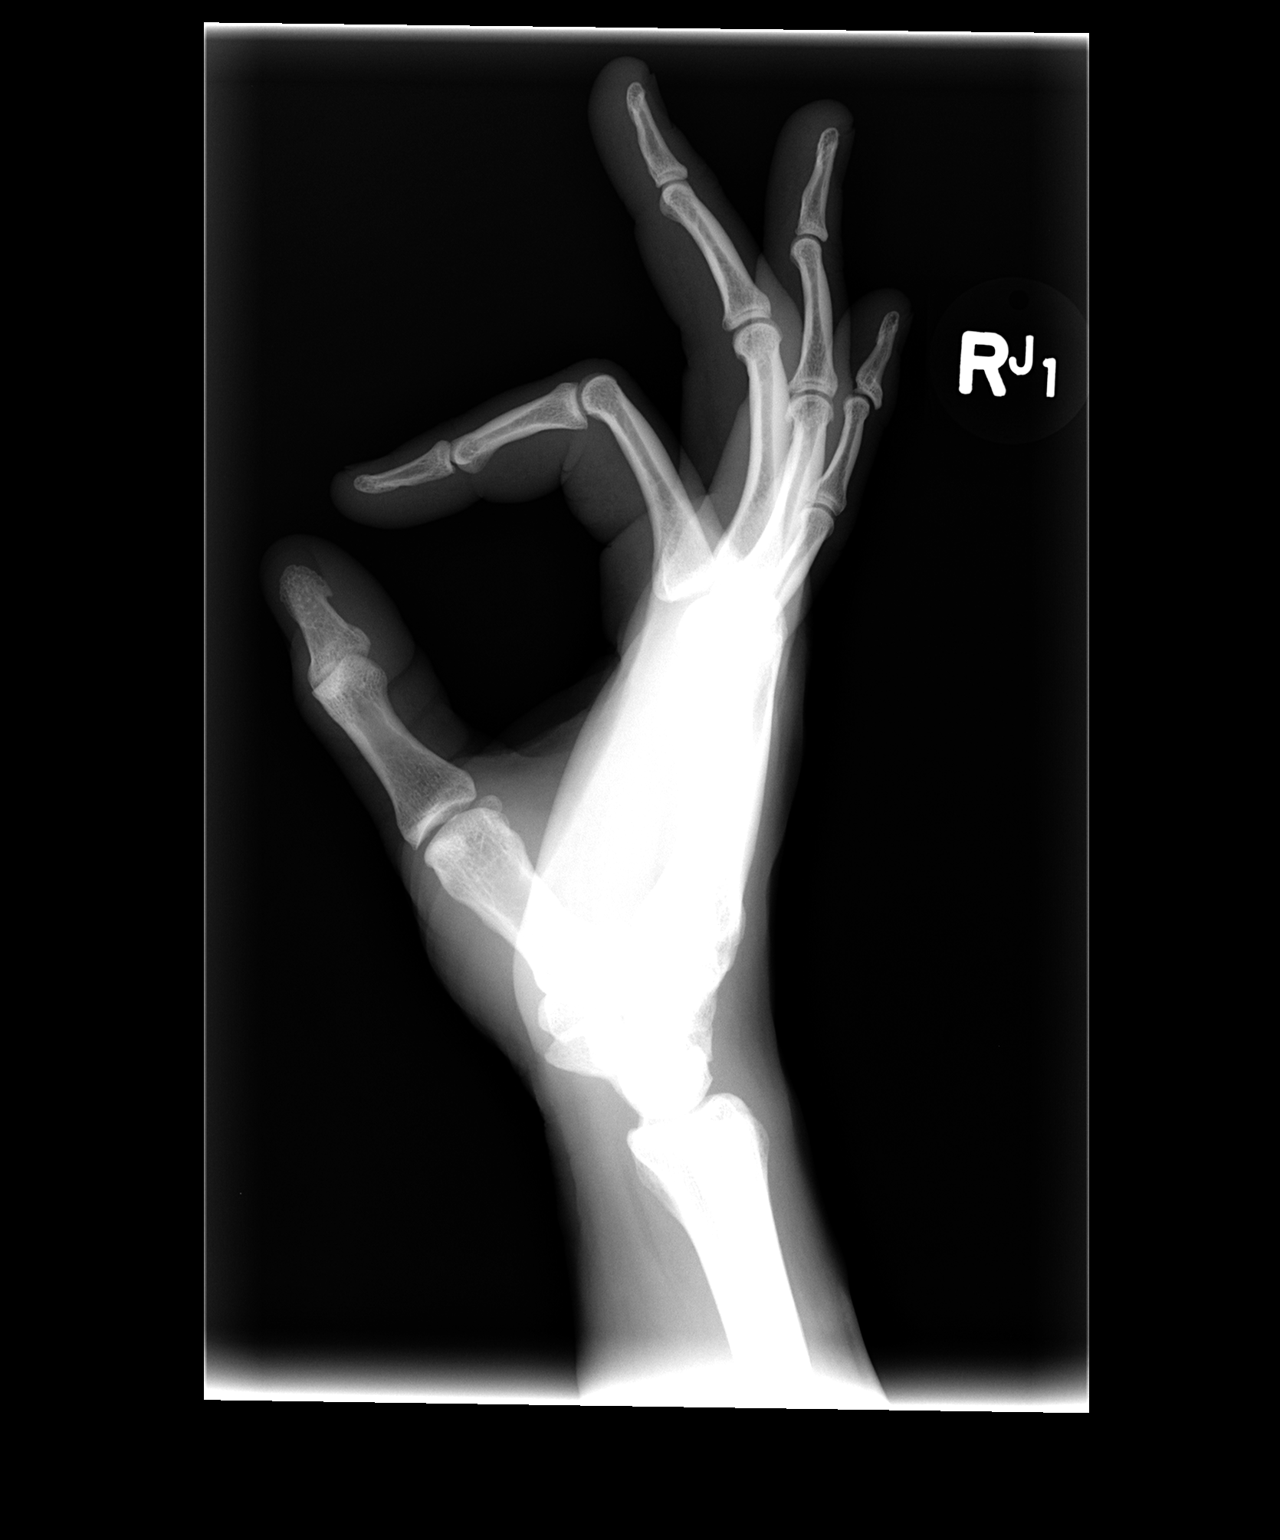

[3 of 3 positions shown; findings below may reference images not displayed]

FINDINGS: There is no evidence of fracture or dislocation.  There
is no evidence of arthropathy or other focal bone abnormality.
Soft tissues are unremarkable.
IMPRESSION: Negative exam.

## 2014-09-05 ENCOUNTER — Telehealth: Payer: Self-pay | Admitting: *Deleted

## 2014-09-05 NOTE — Telephone Encounter (Signed)
Spoke with patient, doing much better since decreasing Victoza to 0.6 mg. Does not have any persistent symptoms from last week. Sugars mainly around 99-120, had one high 180 since changing to lower dose

## 2014-09-05 NOTE — Telephone Encounter (Signed)
Great..I had asked him previously to continue the Victoza at 0.6 mg and in a few weeks increase it to 1.2 mg to see if he tolerates.

## 2014-09-05 NOTE — Telephone Encounter (Signed)
-----   Message from Quentin Cornwalladhika P Phadke, MD sent at 09/05/2014  1:05 PM EST ----- Regarding: FW: call pt to check up on progress Please could you call patient to see how he is doing on the lower dose of Victoza?  thanks ----- Message -----    From: Quentin Cornwalladhika P Phadke, MD    Sent: 08/30/2014   9:20 AM      To: Quillian Quinceadhika Phadke, MD Subject: call pt to check up on progress

## 2014-09-06 ENCOUNTER — Encounter: Payer: Self-pay | Admitting: Family Medicine

## 2014-09-06 ENCOUNTER — Ambulatory Visit (INDEPENDENT_AMBULATORY_CARE_PROVIDER_SITE_OTHER): Payer: Managed Care, Other (non HMO) | Admitting: Family Medicine

## 2014-09-06 VITALS — BP 132/82 | HR 86 | Temp 98.4°F | Wt 221.8 lb

## 2014-09-06 DIAGNOSIS — E119 Type 2 diabetes mellitus without complications: Secondary | ICD-10-CM

## 2014-09-06 NOTE — Progress Notes (Signed)
Pre visit review using our clinic review tool, if applicable. No additional management support is needed unless otherwise documented below in the visit note. 

## 2014-09-06 NOTE — Progress Notes (Signed)
Subjective:    Patient ID: HALE CHALFIN, male    DOB: [birth year], 38 y.o.   MRN: [id number]  HPI  Very pleasant 38 yo male with h/o DM here to discuss recent visit with Dr. Howell Rucks. DM- diagnosed in 2006.  Had been poorly controlled, so I referred him to endocrinology- Dr. Howell Rucks. Last saw her on 08/21/14- note reviewed.  a1c improving but they are adjusting his dose of liraglutide.  He has had some episodes of hypoglycemia at work.  He is asking for note for his employer discussing this. Unfortunately gaining, not losing weight. 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    Lab Results  Component Value Date   HGBA1C 7.3* 08/20/2014     Wt Readings from Last 3 Encounters:  09/06/14 221 lb 12 oz (100.585 kg)  07/05/14 221 lb (100.245 kg)  05/25/14 215 lb (97.523 kg)    Patient Active Problem List   Diagnosis Date Noted  . Hyperlipidemia 08/21/2014  . Pre-syncope 05/25/2014  . Diabetes mellitus    Past Medical History  Diagnosis Date  . Diabetes mellitus    Past Surgical History  Procedure Laterality Date  . Irrigation and debridement abscess Right 10/05/2013    Procedure: IRRIGATION AND DEBRIDEMENT RIGHT CHEST WALL ABCESS;  Surgeon: Pedro Earls, MD;  Location: [facility];  Service: General;  Laterality: Right;  . Shoulder arthroscopy with rotator cuff repair    . Ganglion cyst excision  1993    wrist   History  Substance Use Topics  . Smoking status: Never Smoker   . Smokeless tobacco: Never Used  . Alcohol Use: Yes     Comment: socially   Family History  Problem Relation Age of Onset  . Hypertension Father   . Kidney disease Mother   . Diabetes Mother    No Known Allergies Current Outpatient Prescriptions on File Prior to Visit  Medication Sig Dispense Refill  . Blood Glucose Monitoring Suppl (ONE TOUCH ULTRA SYSTEM KIT)  W/DEVICE KIT 1 kit by Does not apply route once. 30 each 0  . glipiZIDE (GLUCOTROL) 5 MG tablet Take 1 tablet (5 mg total) by mouth 2 (two) times daily before a meal. 60 tablet 3  . glucose blood (ONE TOUCH ULTRA TEST) test strip 1 each by Other route 2 (two) times daily. Use as instructed 50 each 12  . Insulin Pen Needle (BD PEN NEEDLE NANO U/F) 32G X 4 MM MISC Use once daily for insulin administration 30 each 6  . Liraglutide 18 MG/3ML SOPN Inject 0.6 mg [place] daily and in 1 week increase that to 1.2 mg [place] daily 3 pen 3  . metFORMIN (GLUCOPHAGE) 1000 MG tablet TAKE 1 TABLET BY MOUTH TWICE A DAY 60 tablet 4   No current facility-administered medications on file prior to visit.   The PMH, PSH, Social History, Family History, Medications, and allergies have been reviewed in [facility], and have been updated if relevant.   Review of Systems See HPI     Objective:   Physical Exam BP 132/82 mmHg  Pulse 86  Temp(Src) 98.4 F (36.9 C) (Oral)  Wt 221 lb 12 oz (100.585 kg)  SpO2 96%  Wt Readings from Last 3 Encounters:  09/06/14 221 lb 12 oz (100.585 kg)  07/05/14 221 lb (100.245 kg)  05/25/14 215  lb (97.523 kg)    General:  overweght male in NAD Psych:  Good eye contact, not anxious or depressed appearing      Assessment & Plan:   Patient ID: HILERY WINTLE, male   DOB: Dec 28, 1975, 38 y.o.   MRN: [id number]

## 2014-09-06 NOTE — Telephone Encounter (Signed)
Pt notified and verbalized understanding.

## 2014-09-06 NOTE — Assessment & Plan Note (Signed)
>  25 minutes spent in face to face time with patient, >50% spent in counselling or coordination of care Note written discussing what we talked about today- he should be excused or at least allowed to rest at work if he develops any symptoms such as dizziness, sweating, nausea, vomiting, or other symptoms of hypoglycemia while he is adjusting these medications.

## 2014-10-01 ENCOUNTER — Ambulatory Visit (INDEPENDENT_AMBULATORY_CARE_PROVIDER_SITE_OTHER): Payer: Managed Care, Other (non HMO) | Admitting: Internal Medicine

## 2014-10-01 ENCOUNTER — Encounter: Payer: Self-pay | Admitting: Internal Medicine

## 2014-10-01 ENCOUNTER — Encounter: Payer: Self-pay | Admitting: *Deleted

## 2014-10-01 VITALS — BP 120/80 | HR 94 | Temp 98.0°F | Wt 219.0 lb

## 2014-10-01 DIAGNOSIS — M5441 Lumbago with sciatica, right side: Secondary | ICD-10-CM

## 2014-10-01 DIAGNOSIS — M5442 Lumbago with sciatica, left side: Secondary | ICD-10-CM

## 2014-10-01 DIAGNOSIS — M545 Low back pain, unspecified: Secondary | ICD-10-CM | POA: Insufficient documentation

## 2014-10-01 NOTE — Progress Notes (Signed)
   Subjective:    Patient ID: Luke Abbott, male    DOB: 1976/03/01, 38 y.o.   MRN: [id number]  HPI Here with wife and daughter  Started with back pain 4 days ago Doesn't remember any injury Just bent over to get slippers on--- just after getting up in AM Hurts both sides in low back and down both legs--all the way into calves Some tingling feeling in feet  Worst at night Gets better when moving around Has to sit on pillows  Fork lift driver--due back to work [employer]  [facility] for naproxen bid and cyclobenzaprine bid also Doesn't feel this has really helped much  Tried heat a little--may have made it worse  Current Outpatient Prescriptions on File Prior to Visit  Medication Sig Dispense Refill  . glipiZIDE (GLUCOTROL) 5 MG tablet Take 1 tablet (5 mg total) by mouth 2 (two) times daily before a meal. 60 tablet 3  . glucose blood (ONE TOUCH ULTRA TEST) test strip 1 each by Other route 2 (two) times daily. Use as instructed 50 each 12  . Insulin Pen Needle (BD PEN NEEDLE NANO U/F) 32G X 4 MM MISC Use once daily for insulin administration 30 each 6  . metFORMIN (GLUCOPHAGE) 1000 MG tablet TAKE 1 TABLET BY MOUTH TWICE A DAY 60 tablet 4   No current facility-administered medications on file prior to visit.    No Known Allergies  Past Medical History  Diagnosis Date  . Diabetes mellitus     Past Surgical History  Procedure Laterality Date  . Irrigation and debridement abscess Right 10/05/2013    Procedure: IRRIGATION AND DEBRIDEMENT RIGHT CHEST WALL ABCESS;  Surgeon: Valarie MerinoMatthew B Martin, MD;  Location: [facility];  Service: General;  Laterality: Right;  . Shoulder arthroscopy with rotator cuff repair    . Ganglion cyst excision  1993    wrist    Family History  Problem Relation Age of Onset  . Hypertension Father   . Kidney disease Mother   . Diabetes Mother     History   Social History  . Marital Status: Legally Separated    Spouse Name: [name]   Number of Children: 7  . Years of Education: N/A   Occupational History  .  [employer]   Social History Main Topics  . Smoking status: Never Smoker   . Smokeless tobacco: Never Used  . Alcohol Use: Yes     Comment: socially  . Drug Use: No  . Sexual Activity: Yes   Other Topics Concern  . Not on file   Social History Narrative   Review of Systems Legs may be some weak when bending down No lose of bowel or bladder control ---or troubles with this     Objective:   Physical Exam  Constitutional: He appears well-developed and well-nourished. No distress.  Musculoskeletal:  No spine tenderness Normal ROM in both hips SLR is basically negative (pain in right leg when right side up near 80-85 degrees) Limited back flexion  Neurological:  Slow gait but normal No focal weakness          Assessment & Plan:

## 2014-10-01 NOTE — Patient Instructions (Signed)

## 2014-10-01 NOTE — Progress Notes (Signed)
Pre visit review using our clinic review tool, if applicable. No additional management support is needed unless otherwise documented below in the visit note. 

## 2014-10-01 NOTE — Assessment & Plan Note (Signed)
Classic strain pattern No worrisome features Will have him continue the naproxen bid--cyclobenzaprine prn at night only Try heat/ice Out of work till 12/3 (goes in at [facility]) May need PT referral or consider tramadol if not improving

## 2014-11-21 ENCOUNTER — Encounter: Payer: Self-pay | Admitting: Endocrinology

## 2014-11-21 ENCOUNTER — Ambulatory Visit (INDEPENDENT_AMBULATORY_CARE_PROVIDER_SITE_OTHER): Payer: Managed Care, Other (non HMO) | Admitting: Endocrinology

## 2014-11-21 VITALS — BP 112/76 | HR 95 | Resp 14 | Ht 73.0 in | Wt 217.5 lb

## 2014-11-21 DIAGNOSIS — E119 Type 2 diabetes mellitus without complications: Secondary | ICD-10-CM

## 2014-11-21 DIAGNOSIS — E785 Hyperlipidemia, unspecified: Secondary | ICD-10-CM

## 2014-11-21 NOTE — Progress Notes (Signed)
Pre visit review using our clinic review tool, if applicable. No additional management support is needed unless otherwise documented below in the visit note. 

## 2014-11-21 NOTE — Progress Notes (Signed)
REASON FOR VISIT- Luke Abbott is a 39 y.o.-year-old male, here for follow up management of Type 2 Diabetes Mellitus, uncontrolled, without complications ( Dxed 2007, is on combination of orals and basal insulin).  Last seen by me about Oct 2015.    Currently taking  - Lantus  14 Units at bedtime ( restart Nov 2015)  - Metformin 1000 mg twice daily ( morning and night time) - Glipizide  5mg  twice daily  ( didn't adjust down to 5 mg daily as instructed last visit)  * Stopped using Januvia due to scratchy throat and expense of medication, around August 2015. * October 2015- tried on Victoza instead of Lantus , but he didn't tolerate even the lower dose 0.6 mg due to GI side effects ( N/diarrhea)- gave him a longer trial on lower dose- but didn't tolerate and went back to taking lantus on how own   Last few hemoglobin A1cs are as follows- Lab Results  Component Value Date   HGBA1C 7.3* 08/20/2014   HGBA1C 11.4* 05/10/2014   HGBA1C 12.2* 12/18/2013     Patient checks his sugars 1-2 times daily- sometimes every other day with a  One Touch Ultra glucometer.  By recall  his sugars are-  PREMEAL Breakfast Lunch Dinner Bedtime Overall  Glucose range: 99-115  140-180    Mean/median:         Hypoglycemia-  Few recent lows. Lowest sugar was 58 in the morning, ~6am happening about 4 x weekly. Generally works from [employer]am to 4 pm. Takes lantus at 7 pm with Glipizide.   ; he has hypoglycemia awareness at 69.   Has been working on his diet and reducing carbs in meals/snacks. Eats 2-three times daily. Recd call to reschedule DM education appt and hasn't called back to reschedule. Now doesn't have the time to go  Back to them  Walking for exercise at work. [employer] worker at [employer]. Works in [employer]. Weight trending down  Wt Readings from Last 3 Encounters:  11/21/14 217 lb 8 oz (98.657 kg)  10/01/14 219 lb (99.338 kg)  09/06/14 221 lb 12 oz (100.585 kg)    Diabetes Complications-   No  CKD, last BUN/creatinine . Prior positive urine MA in setting of higher sugars, ratio okay.  Lab Results  Component Value Date   BUN 12 08/20/2014   CREATININE 0.8 08/20/2014    Lab Results  Component Value Date   GFR 132.69 08/20/2014    Lab Results  Component Value Date   MICROALBUR 4.4* 08/20/2014   Lab Results  Component Value Date   MICRALBCREAT 2.1 08/20/2014     Last DEE was in Feb 2015 No numbness and tingling in his feet. No known neuropathy.  No CAD. No history of  prior stroke.   Hyperlipidemia-  Currently on dietary therapy. Somewhat compliant. his last set of lipids were uncontrolled recently-   Lab Results  Component Value Date   CHOL 162 08/20/2014   HDL 25.80* 08/20/2014   LDLCALC 128* 08/20/2014   TRIG 42.0 08/20/2014   CHOLHDL 6 08/20/2014    BP -  BP well controlled today.   I have reviewed the patient's past medical history, medications and allergies.   Outpatient Encounter Prescriptions as of 11/21/2014  Medication Sig Note  . glipiZIDE (GLUCOTROL) 5 MG tablet Take 1 tablet (5 mg total) by mouth 2 (two) times daily before a meal.   . glucose blood (ONE TOUCH ULTRA TEST) test strip 1 each by  Other route 2 (two) times daily. Use as instructed   . Insulin Pen Needle (BD PEN NEEDLE NANO U/F) 32G X 4 MM MISC Use once daily for insulin administration   . LANTUS SOLOSTAR 100 UNIT/ML Solostar Pen Inject 14 Units into the skin at bedtime. 11/21/2014: Received from: External Pharmacy Received Sig:   . metFORMIN (GLUCOPHAGE) 1000 MG tablet TAKE 1 TABLET BY MOUTH TWICE A DAY   . [DISCONTINUED] cyclobenzaprine (FLEXERIL) 10 MG tablet Take 10 mg by mouth 2 (two) times daily as needed.  10/01/2014: Received from: External Pharmacy  . [DISCONTINUED] naproxen (NAPROSYN) 500 MG tablet Take 500 mg by mouth 2 (two) times daily with a meal.  10/01/2014: Received from: External Pharmacy   No Known Allergies  REVIEW OF SYSTEMS- Review of Systems- [ x ]  Complains  of    [  ]  denies [  ] Recent weight change [ x ]  Fatigue [ x ] polydipsia [  ] polyuria [  ]  nocturia [x  ]  vision difficulty [  ] chest pain [  ] shortness of breath [  ] leg swelling [  ] cough [ x ] nausea/vomiting [ x ] diarrhea [  ] constipation [  ] abdominal pain [ x ]  tingling/numbness in extremities [  ]  concern with feet ( wounds/sores)   PHYSICAL EXAM- BP 112/76 mmHg  Pulse 95  Resp 14  Ht  (1.854 m)  Wt 217 lb 8 oz (98.657 kg)  BMI 28.70 kg/m2  SpO2 98% Wt Readings from Last 3 Encounters:  11/21/14 217 lb 8 oz (98.657 kg)  10/01/14 219 lb (99.338 kg)  09/06/14 221 lb 12 oz (100.585 kg)    Exam: deferred     ASSESSMENT/PLAN- Problem List Items Addressed This Visit      Endocrine   Diabetes mellitus - Primary    Will update A1c next week with lab appt.  Check sugars 1-2 x daily.  Discussed about medication options for better sugars including other DPPIV, SGLT2 or other GLP-1 instead of basal insulin. Discussed pros and cons.  He wishes to continue current regimen for now.   Will adjust Lantus to 10 units daily.  If sugars are still staying low mainly in the morning time, then he is aware to notify me so that the Glipizide could be titrated downward.   Reminded him to get eye appt.    RTC 3 months        Relevant Medications   LANTUS SOLOSTAR 100 UNIT/ML Solostar Pen   Other Relevant Orders   Comprehensive metabolic panel   Hemoglobin A1c     Other   Hyperlipidemia    Recent LDL not at goal. Asked him to lower fat content in meals and recheck next week when fasting. If LDL is still elevated then will consider statin therapy. We briefly discussed about this during this visit.          Relevant Orders   Comprehensive metabolic panel   Hemoglobin A1c   Lipid panel      - RTC 3 months. Follow up labs next week  When fasting 25 minutes spent with the patient, >50% time spent on discussion of topics mentioned above.       Bethzaida Boord PUSHKAR 11/22/2014, 1:13 PM

## 2014-11-21 NOTE — Patient Instructions (Signed)
Continue current metformin and Glipizide.  Decrease lantus to 10 units daily.  If inspite of this you continue to have low sugar readings in the morning, please let me know so that I can adjust your Glipizide dose.   Come on 11/26/14 for lab appt ( fasting).  Keep your eye exam appt for this year.   Please come back for a follow-up appointment in 3 months

## 2014-11-22 NOTE — Assessment & Plan Note (Signed)
Will update A1c next week with lab appt.  Check sugars 1-2 x daily.  Discussed about medication options for better sugars including other DPPIV, SGLT2 or other GLP-1 instead of basal insulin. Discussed pros and cons.  He wishes to continue current regimen for now.   Will adjust Lantus to 10 units daily.  If sugars are still staying low mainly in the morning time, then he is aware to notify me so that the Glipizide could be titrated downward.   Reminded him to get eye appt.    RTC 3 months

## 2014-11-22 NOTE — Assessment & Plan Note (Signed)
Recent LDL not at goal. Asked him to lower fat content in meals and recheck next week when fasting. If LDL is still elevated then will consider statin therapy. We briefly discussed about this during this visit.

## 2014-11-26 ENCOUNTER — Other Ambulatory Visit (INDEPENDENT_AMBULATORY_CARE_PROVIDER_SITE_OTHER): Payer: Managed Care, Other (non HMO)

## 2014-11-26 DIAGNOSIS — E119 Type 2 diabetes mellitus without complications: Secondary | ICD-10-CM

## 2014-11-26 DIAGNOSIS — E785 Hyperlipidemia, unspecified: Secondary | ICD-10-CM

## 2014-11-26 LAB — COMPREHENSIVE METABOLIC PANEL
ALT: 26 U/L (ref 0–53)
AST: 16 U/L (ref 0–37)
Albumin: 4.1 g/dL (ref 3.5–5.2)
Alkaline Phosphatase: 86 U/L (ref 39–117)
BUN: 14 mg/dL (ref 6–23)
CHLORIDE: 101 meq/L (ref 96–112)
CO2: 26 mEq/L (ref 19–32)
Calcium: 9.6 mg/dL (ref 8.4–10.5)
Creatinine, Ser: 0.83 mg/dL (ref 0.40–1.50)
GFR: 132.5 mL/min (ref >60.00)
GLUCOSE: 233 mg/dL — AB (ref 70–99)
POTASSIUM: 4.2 meq/L (ref 3.5–5.1)
Sodium: 135 mEq/L (ref 135–145)
Total Bilirubin: 0.6 mg/dL (ref 0.2–1.2)
Total Protein: 7.7 g/dL (ref 6.0–8.3)

## 2014-11-26 LAB — LIPID PANEL
Cholesterol: 195 mg/dL (ref 0–200)
HDL: 33.3 mg/dL — ABNORMAL LOW (ref >39.00)
LDL Cholesterol: 144 mg/dL — ABNORMAL HIGH (ref 0–99)
NONHDL: 161.7
TRIGLYCERIDES: 89 mg/dL (ref 0.0–149.0)
Total CHOL/HDL Ratio: 6
VLDL: 17.8 mg/dL (ref 0.0–40.0)

## 2014-11-26 LAB — HEMOGLOBIN A1C: Hgb A1c MFr Bld: 8.8 % — ABNORMAL HIGH (ref 4.6–6.5)

## 2014-12-28 ENCOUNTER — Encounter: Payer: Self-pay | Admitting: Internal Medicine

## 2014-12-28 ENCOUNTER — Ambulatory Visit (INDEPENDENT_AMBULATORY_CARE_PROVIDER_SITE_OTHER): Payer: Managed Care, Other (non HMO) | Admitting: Internal Medicine

## 2014-12-28 VITALS — BP 120/74 | HR 79 | Temp 98.2°F | Wt 219.5 lb

## 2014-12-28 DIAGNOSIS — S7012XA Contusion of left thigh, initial encounter: Secondary | ICD-10-CM

## 2014-12-28 NOTE — Patient Instructions (Signed)
Contusion °A contusion is a deep bruise. Contusions are the result of an injury that caused bleeding under the skin. The contusion may turn blue, purple, or yellow. Minor injuries will give you a painless contusion, but more severe contusions may stay painful and swollen for a few weeks.  °CAUSES  °A contusion is usually caused by a blow, trauma, or direct force to an area of the body. °SYMPTOMS  °· Swelling and redness of the injured area. °· Bruising of the injured area. °· Tenderness and soreness of the injured area. °· Pain. °DIAGNOSIS  °The diagnosis can be made by taking a history and physical exam. An X-ray, CT scan, or MRI may be needed to determine if there were any associated injuries, such as fractures. °TREATMENT  °Specific treatment will depend on what area of the body was injured. In general, the best treatment for a contusion is resting, icing, elevating, and applying cold compresses to the injured area. Over-the-counter medicines may also be recommended for pain control. Ask your caregiver what the best treatment is for your contusion. °HOME CARE INSTRUCTIONS  °· Put ice on the injured area. °¨ Put ice in a plastic bag. °¨ Place a towel between your skin and the bag. °¨ Leave the ice on for 15-20 minutes, 3-4 times a day, or as directed by your health care provider. °· Only take over-the-counter or prescription medicines for pain, discomfort, or fever as directed by your caregiver. Your caregiver may recommend avoiding anti-inflammatory medicines (aspirin, ibuprofen, and naproxen) for 48 hours because these medicines may increase bruising. °· Rest the injured area. °· If possible, elevate the injured area to reduce swelling. °SEEK IMMEDIATE MEDICAL CARE IF:  °· You have increased bruising or swelling. °· You have pain that is getting worse. °· Your swelling or pain is not relieved with medicines. °MAKE SURE YOU:  °· Understand these instructions. °· Will watch your condition. °· Will get help right  away if you are not doing well or get worse. °Document Released: 07/29/2005 Document Revised: 10/24/2013 Document Reviewed: 08/24/2011 °ExitCare® Patient Information ©2015 [employer]. This information is not intended to replace advice given to you by your health care provider. Make sure you discuss any questions you have with your health care provider. ° °

## 2014-12-28 NOTE — Progress Notes (Signed)
Pre visit review using our clinic review tool, if applicable. No additional management support is needed unless otherwise documented below in the visit note. 

## 2014-12-28 NOTE — Progress Notes (Signed)
Subjective:    Patient ID: Luke Abbott, male    DOB: Jun 01, 1976, 39 y.o.   MRN: [id number]  HPI  Pt presents to the clinic today with c/o a bruise to his inner thigh. He reports that he gave himself an insulin injection there this morning. He did feel pain when he was injecting himself. Since that time, a bruise has developed. It is not painful, just a little sore. He has not noticed warmth or redness.  Review of Systems      Past Medical History  Diagnosis Date  . Diabetes mellitus     Current Outpatient Prescriptions  Medication Sig Dispense Refill  . glipiZIDE (GLUCOTROL) 5 MG tablet Take 1 tablet (5 mg total) by mouth 2 (two) times daily before a meal. 60 tablet 3  . glucose blood (ONE TOUCH ULTRA TEST) test strip 1 each by Other route 2 (two) times daily. Use as instructed 50 each 12  . Insulin Pen Needle (BD PEN NEEDLE NANO U/F) 32G X 4 MM MISC Use once daily for insulin administration 30 each 6  . LANTUS SOLOSTAR 100 UNIT/ML Solostar Pen Inject 14 Units into the skin at bedtime.    . metFORMIN (GLUCOPHAGE) 1000 MG tablet TAKE 1 TABLET BY MOUTH TWICE A DAY 60 tablet 4   No current facility-administered medications for this visit.    No Known Allergies  Family History  Problem Relation Age of Onset  . Hypertension Father   . Kidney disease Mother   . Diabetes Mother     History   Social History  . Marital Status: Legally Separated    Spouse Name: [name]  . Number of Children: 7  . Years of Education: N/A   Occupational History  .  [name]   Social History Main Topics  . Smoking status: Never Smoker   . Smokeless tobacco: Never Used  . Alcohol Use: Yes     Comment: socially  . Drug Use: No  . Sexual Activity: Yes   Other Topics Concern  . Not on file   Social History Narrative     Constitutional: Denies fever, malaise, fatigue, headache or abrupt weight changes.  Musculoskeletal: Denies decrease in range of motion, difficulty with  gait, muscle pain or joint pain and swelling.  Skin: Pt reports bruise to inner thigh. Denies rashes, lesions or ulcercations.   No other specific complaints in a complete review of systems (except as listed in HPI above).  Objective:   Physical Exam   BP 120/74 mmHg  Pulse 79  Temp(Src) 98.2 F (36.8 C) (Oral)  Wt 219 lb 8 oz (99.565 kg)  SpO2 98% Wt Readings from Last 3 Encounters:  12/28/14 219 lb 8 oz (99.565 kg)  11/21/14 217 lb 8 oz (98.657 kg)  10/01/14 219 lb (99.338 kg)    General: Appears his stated age, well developed, well nourished in NAD. Skin: Warm, dry and intact. Small contusion to left medial thigh. No hematoma, warmth or redness noted. Cardiovascular: Normal rate and rhythm. S1,S2 noted.  No murmur, rubs or gallops noted.  Pulmonary/Chest: Normal effort and positive vesicular breath sounds. No respiratory distress. No wheezes, rales or ronchi noted.  Musculoskeletal: Normal flexion and extension of left leg. No difficulty with gait.    BMET    Component Value Date/Time   NA 135 11/26/2014 1006   K 4.2 11/26/2014 1006   CL 101 11/26/2014 1006   CO2 26 11/26/2014 1006   GLUCOSE 233* 11/26/2014 1006  BUN 14 11/26/2014 1006   CREATININE 0.83 11/26/2014 1006   CALCIUM 9.6 11/26/2014 1006   GFRNONAA >90 10/06/2013 0451   GFRAA >90 10/06/2013 0451    Lipid Panel     Component Value Date/Time   CHOL 195 11/26/2014 1006   TRIG 89.0 11/26/2014 1006   HDL 33.30* 11/26/2014 1006   CHOLHDL 6 11/26/2014 1006   VLDL 17.8 11/26/2014 1006   LDLCALC 144* 11/26/2014 1006    CBC    Component Value Date/Time   WBC 9.2 10/06/2013 0451   RBC 4.32 10/06/2013 0451   HGB 11.9* 10/06/2013 0451   HCT 36.5* 10/06/2013 0451   PLT 225 10/06/2013 0451   MCV 84.5 10/06/2013 0451   MCH 27.5 10/06/2013 0451   MCHC 32.6 10/06/2013 0451   RDW 11.8 10/06/2013 0451   LYMPHSABS 2.0 10/03/2013 0210   MONOABS 1.0 10/03/2013 0210   EOSABS 0.1 10/03/2013 0210   BASOSABS  0.0 10/03/2013 0210    Hgb A1C Lab Results  Component Value Date   HGBA1C 8.8* 11/26/2014        Assessment & Plan:   Contusion of left medial thigh:  Superficial Symptomatic care at this time- ice, ibuprofen Advised him to avoid given his insulin injection in the medial thigh area Watch for hardening of the area, warmth or redness Work note provided  RTC as needed or if symptoms persist or worsen

## 2015-01-01 ENCOUNTER — Other Ambulatory Visit: Payer: Self-pay | Admitting: *Deleted

## 2015-01-01 MED ORDER — LANTUS SOLOSTAR 100 UNIT/ML ~~LOC~~ SOPN: 14.0000 [IU] | PEN_INJECTOR | Freq: Every day | SUBCUTANEOUS | Status: DC

## 2015-01-01 MED ORDER — GLIPIZIDE 5 MG PO TABS: 5.0000 mg | ORAL_TABLET | Freq: Two times a day (BID) | ORAL | Status: DC

## 2015-01-01 NOTE — Telephone Encounter (Signed)
Pt called, needing refills. Rx sent to pharmacy by escript  

## 2015-02-14 ENCOUNTER — Encounter: Payer: Self-pay | Admitting: Family Medicine

## 2015-02-14 ENCOUNTER — Ambulatory Visit (INDEPENDENT_AMBULATORY_CARE_PROVIDER_SITE_OTHER): Payer: Managed Care, Other (non HMO) | Admitting: Family Medicine

## 2015-02-14 VITALS — BP 130/90 | HR 89 | Temp 98.4°F | Ht 73.0 in | Wt 217.8 lb

## 2015-02-14 DIAGNOSIS — R0789 Other chest pain: Secondary | ICD-10-CM | POA: Diagnosis not present

## 2015-02-14 DIAGNOSIS — M940 Chondrocostal junction syndrome [Tietze]: Secondary | ICD-10-CM

## 2015-02-14 DIAGNOSIS — J301 Allergic rhinitis due to pollen: Secondary | ICD-10-CM

## 2015-02-14 MED ORDER — FLUTICASONE PROPIONATE 50 MCG/ACT NA SUSP: 2.0000 | Freq: Every day | NASAL | Status: DC

## 2015-02-14 NOTE — Progress Notes (Signed)
Dr. Karleen Hampshire T. Alexus Michael, MD, CAQ [facility] [address] [place], [address] Phone: [phone] Fax: [phone]  02/14/2015  Patient: Luke Abbott, MRN: [id number], DOB: 12/20/1975, 39 y.o.  Primary Physician:  [name], MD  Chief Complaint: Chest Pain and Headache  Subjective:   Luke Abbott is a 39 y.o. very pleasant male patient who presents with the following:  Left sided chest pain since yesterday. A few days before this was moving some heavy equipment at work. Prior to this, no chest pains.   Headache making sleepy.  Light bothering him. No h/o migraines. Mostly in the last few weeks.  Lab Results  Component Value Date   HGBA1C 8.8* 11/26/2014    DM since 2007.  Having some pain in the chest with coughing.  No cardiac disease in the family. No smoking, no history of drug use.  No h/o exertional dyspnea  Lipids:    Component Value Date/Time   CHOL 195 11/26/2014 1006   TRIG 89.0 11/26/2014 1006   HDL 33.30* 11/26/2014 1006   VLDL 17.8 11/26/2014 1006   CHOLHDL 6 11/26/2014 1006     Past Medical History, Surgical History, Social History, Family History, Problem List, Medications, and Allergies have been reviewed and updated if relevant.  ROS: GEN: Acute illness details above GI: Tolerating PO intake GU: maintaining adequate hydration and urination Pulm: No SOB Interactive and getting along well at home.  Otherwise, ROS is as per the HPI.   Objective:   BP 130/90 mmHg  Pulse 89  Temp(Src) 98.4 F (36.9 C) (Oral)  Ht  (1.854 m)  Wt 217 lb 12 oz (98.771 kg)  BMI 28.73 kg/m2  SpO2 97%   GEN: WDWN, NAD, Non-toxic, A & O x 3 HEENT: Atraumatic, Normocephalic. Neck supple. No masses, No LAD. Ears and Nose: No external deformity. CV: RRR, No M/G/R. No JVD. No thrill. No extra heart sounds. Chest wall TTP, L anterior chest wall. PULM: CTA B, no wheezes, crackles, rhonchi. No retractions. No  resp. distress. No accessory muscle use. EXTR: No c/c/e NEURO Normal gait.  PSYCH: Normally interactive. Conversant. Not depressed or anxious appearing.  Calm demeanor.     Laboratory and Imaging Data:  Assessment and Plan:   Costochondritis  Other chest pain - Plan: EKG 12-Lead  Allergic rhinitis due to pollen  EKG: Normal sinus rhythm. Normal axis, normal R wave progression, No acute ST elevation or depression.   Reproducible musculoskeletal chest pain on the left side of anterior chest wall likely produced from work.  Costochondritis.  Recommended relative rest and NSAIDs over the next few days.  Given work note for the next 2 days.  Worsening headache in the last 2-3 weeks likely secondary to worsening allergic rhinitis.  Recommended intranasal steroids as well as antihistamines.  Refer to the patient instructions sections for details of plan shared with patient.   Follow-up: No Follow-up on file.  New Prescriptions   FLUTICASONE (FLONASE) 50 MCG/ACT NASAL SPRAY    Place 2 sprays into both nostrils daily.   Orders Placed This Encounter  Procedures  . EKG 12-Lead   Patient Instructions  ALLERGIES -Sneezing, runny and stuffy nose, itchy eyes, nose, throat, tears.  TREATMENT 1. Avoid offending allergen 2. Air filtration devices can reduce concentration of allergen 3. Intranasal steroid sprays are the most effective control (Flonase and Nasacort are over the counter now.) 4. Anti-histamines. Claritin (generic loratadine), Zyrtec (generic certrizine), and Allegra (  fexofenadine) are all over the counter now. Cheap at [facility], [facility], [facility]. Pick 1 - sometimes it is helpful to try and see which one helps you the most or vary it up some over time. 5. You can also do an extra tablet of Benadryl 25 mg at night if needed. It makes you somewhat drowsy. 6. Presciption anti-histamines are available if the above fail    Motrin 800 mg recommended TID. (Over the counter  Motrin, Advil, or Generic Ibuprofen 200 mg tablets. 3-4 tablets by mouth 3 times a day. This equals a prescription strength dose.)      Signed,  Zyanna Leisinger T. Yug Loria, MD   Patient's Medications  New Prescriptions   FLUTICASONE (FLONASE) 50 MCG/ACT NASAL SPRAY    Place 2 sprays into both nostrils daily.  Previous Medications   GLIPIZIDE (GLUCOTROL) 5 MG TABLET    Take 1 tablet (5 mg total) by mouth 2 (two) times daily before a meal.   GLUCOSE BLOOD (ONE TOUCH ULTRA TEST) TEST STRIP    1 each by Other route 2 (two) times daily. Use as instructed   INSULIN PEN NEEDLE (BD PEN NEEDLE NANO U/F) 32G X 4 MM MISC    Use once daily for insulin administration   LANTUS SOLOSTAR 100 UNIT/ML SOLOSTAR PEN    Inject 14 Units into the skin at bedtime.   METFORMIN (GLUCOPHAGE) 1000 MG TABLET    TAKE 1 TABLET BY MOUTH TWICE A DAY  Modified Medications   No medications on file  Discontinued Medications   No medications on file

## 2015-02-14 NOTE — Progress Notes (Signed)
Pre visit review using our clinic review tool, if applicable. No additional management support is needed unless otherwise documented below in the visit note. 

## 2015-02-14 NOTE — Patient Instructions (Addendum)
ALLERGIES -Sneezing, runny and stuffy nose, itchy eyes, nose, throat, tears.  TREATMENT 1. Avoid offending allergen 2. Air filtration devices can reduce concentration of allergen 3. Intranasal steroid sprays are the most effective control (Flonase and Nasacort are over the counter now.) 4. Anti-histamines. Claritin (generic loratadine), Zyrtec (generic certrizine), and Allegra (fexofenadine) are all over the counter now. Cheap at [facility], [facility], [facility]. Pick 1 - sometimes it is helpful to try and see which one helps you the most or vary it up some over time. 5. You can also do an extra tablet of Benadryl 25 mg at night if needed. It makes you somewhat drowsy. 6. Presciption anti-histamines are available if the above fail    Motrin 800 mg recommended TID. (Over the counter Motrin, Advil, or Generic Ibuprofen 200 mg tablets. 3-4 tablets by mouth 3 times a day. This equals a prescription strength dose.)

## 2015-02-20 ENCOUNTER — Encounter: Payer: Self-pay | Admitting: Endocrinology

## 2015-02-20 ENCOUNTER — Ambulatory Visit (INDEPENDENT_AMBULATORY_CARE_PROVIDER_SITE_OTHER): Payer: Managed Care, Other (non HMO) | Admitting: Endocrinology

## 2015-02-20 VITALS — BP 126/74 | HR 90 | Temp 98.6°F | Resp 16 | Ht 72.0 in | Wt 217.0 lb

## 2015-02-20 DIAGNOSIS — E119 Type 2 diabetes mellitus without complications: Secondary | ICD-10-CM | POA: Diagnosis not present

## 2015-02-20 DIAGNOSIS — E785 Hyperlipidemia, unspecified: Secondary | ICD-10-CM

## 2015-02-20 NOTE — Patient Instructions (Signed)
Check sugars 2 x daily ( before breakfast and before supper).  Record them in a log book and bring that/meter to next appointment.  Continue current medications.  Return fasting labs for this Monday.   Please come back for a follow-up appointment in 1 month.

## 2015-02-20 NOTE — Assessment & Plan Note (Signed)
Will update A1c next week with lab appt.  Check sugars 2 x daily.  Discussed about medication options for better sugars including other DPPIV, SGLT2 or other GLP-1 instead of basal insulin. Discussed pros and cons at prior visit. Currently don't have any sugars or A1c to make any changes. Continue current regimen for now- after A1c resulted and follow up appt, can consider stopping SU and starting SGLt2.   He can check his sugars on his palm/forearm. Discussed pros and cons of alternative site testing.   Reminded him to get eye appt.

## 2015-02-20 NOTE — Assessment & Plan Note (Signed)
Recent LDL not at goal. He has lowered fat content in meals since last time. Check lipids next week when fasting. If LDL is still elevated then will consider statin therapy.

## 2015-02-20 NOTE — Progress Notes (Signed)
Pre visit review using our clinic review tool, if applicable. No additional management support is needed unless otherwise documented below in the visit note. 

## 2015-02-20 NOTE — Progress Notes (Signed)
REASON FOR VISIT- Luke Abbott is a 39 y.o.-year-old male, here for follow up management of Type 2 Diabetes Mellitus, uncontrolled, without complications ( Dxed 2007, is on combination of orals and basal insulin).  Last seen by me about Jan 2016.    Currently taking  - Lantus  10-12 Units at bedtime ( restart Nov 2015) >>self titrated since last time - Metformin 1000 mg twice daily ( morning and night time) - Glipizide   once daily    * Stopped using Januvia due to scratchy throat and expense of medication, around August 2015. * October 2015- tried on Victoza instead of Lantus , but he didn't tolerate even the lower dose 0.6 mg due to GI side effects ( N/diarrhea)- gave him a longer trial on lower dose- but didn't tolerate and went back to taking lantus on how own   Last few hemoglobin A1cs are as follows- Lab Results  Component Value Date   HGBA1C 8.8* 11/26/2014   HGBA1C 7.3* 08/20/2014   HGBA1C 11.4* 05/10/2014     Patient checks his sugars 0 times daily with a  One Touch Ultra glucometer.  By recall  his sugars are-  PREMEAL Breakfast Lunch Dinner Bedtime Overall  Glucose range:     n/a  Mean/median:        *Sleepy and tired sometimes and this usually happens when his sugars are high   Hypoglycemia-  Few recent symptomatic lows. Lowest sugar was n/a in the morning,  Generally works from 3am to 4 pm. Takes lantus at 7 pm with Glipizide.   ; he has hypoglycemia awareness at 70.   Has been working on his diet and reducing carbs in meals/snacks. Eats 2-three times daily. hasnt done so well recently. 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 describes his work conditions and hours as being quite long and hard- doesn't feel that it is safe to check his FS glucose Weight stable  Wt Readings from Last 3 Encounters:  02/20/15 217 lb (98.431 kg)  02/14/15 217 lb 12 oz (98.771 kg)   12/28/14 219 lb 8 oz (99.565 kg)    Diabetes Complications-  No  CKD, last BUN/creatinine . Prior positive urine MA in setting of higher sugars, ratio okay.  Lab Results  Component Value Date   BUN 14 11/26/2014   CREATININE 0.83 11/26/2014    Lab Results  Component Value Date   GFR 132.50 11/26/2014    Lab Results  Component Value Date   MICROALBUR 4.4* 08/20/2014   Lab Results  Component Value Date   MICRALBCREAT 2.1 08/20/2014     Last DEE was in Feb 2015>>hasnt been to one this year No numbness and tingling in his feet. No known neuropathy.  No CAD. No history of  prior stroke.   Hyperlipidemia-  Currently on dietary therapy. Somewhat compliant. his last set of lipids were uncontrolled recently-   Lab Results  Component Value Date   CHOL 195 11/26/2014   HDL 33.30* 11/26/2014   LDLCALC 144* 11/26/2014   TRIG 89.0 11/26/2014   CHOLHDL 6 11/26/2014    BP -  BP well controlled today.   I have reviewed the patient's past medical history, medications and allergies.   Outpatient Encounter Prescriptions as of 02/20/2015  Medication Sig  . fluticasone (FLONASE) 50 MCG/ACT nasal spray Place 2 sprays into both nostrils daily.  Marland Kitchen glipiZIDE (GLUCOTROL) 5 MG  tablet Take 1 tablet (5 mg total) by mouth 2 (two) times daily before a meal.  . glucose blood (ONE TOUCH ULTRA TEST) test strip 1 each by Other route 2 (two) times daily. Use as instructed  . Insulin Pen Needle (BD PEN NEEDLE NANO U/F) 32G X 4 MM MISC Use once daily for insulin administration  . LANTUS SOLOSTAR 100 UNIT/ML Solostar Pen Inject 14 Units into the skin at bedtime.  . metFORMIN (GLUCOPHAGE) 1000 MG tablet TAKE 1 TABLET BY MOUTH TWICE A DAY   No Known Allergies  Review of Systems- [ x ]  Complains of    [  ]  denies [  ] Recent weight change [  ]  Fatigue [  ] polydipsia [  ] polyuria [  ]  nocturia [  ]  vision difficulty [  ] chest pain [  ] shortness of breath [  ] leg swelling [  ] cough [  ]  nausea/vomiting [  ] diarrhea [  ] constipation [  ] abdominal pain [  ]  tingling/numbness in extremities [  ]  concern with feet ( wounds/sores)    PHYSICAL EXAM- BP 126/74 mmHg  Pulse 90  Temp(Src) 98.6 F (37 C) (Oral)  Resp 16  Ht 6' (1.829 m)  Wt 217 lb (98.431 kg)  BMI 29.42 kg/m2  SpO2 96% Wt Readings from Last 3 Encounters:  02/20/15 217 lb (98.431 kg)  02/14/15 217 lb 12 oz (98.771 kg)  12/28/14 219 lb 8 oz (99.565 kg)    Exam: deferred     ASSESSMENT/PLAN- Problem List Items Addressed This Visit      Endocrine   Diabetes mellitus - Primary    Will update A1c next week with lab appt.  Check sugars 2 x daily.  Discussed about medication options for better sugars including other DPPIV, SGLT2 or other GLP-1 instead of basal insulin. Discussed pros and cons at prior visit. Currently don't have any sugars or A1c to make any changes. Continue current regimen for now- after A1c resulted and follow up appt, can consider stopping SU and starting SGLt2.   He can check his sugars on his palm/forearm. Discussed pros and cons of alternative site testing.   Reminded him to get eye appt.            Relevant Orders   Comprehensive metabolic panel   Hemoglobin A1c   Lipid panel     Other   Hyperlipidemia    Recent LDL not at goal. He has lowered fat content in meals since last time. Check lipids next week when fasting. If LDL is still elevated then will consider statin therapy.          Relevant Orders   Comprehensive metabolic panel   Hemoglobin A1c   Lipid panel      - RTC 1 months. Follow up labs next week  When fasting      Alecia Doi PUSHKAR 02/20/2015, 4:14 PM

## 2015-02-24 NOTE — Op Note (Signed)
PATIENT NAME:  Luke FoersterBROWN, Demosthenes R MR#:  [id number] DATE OF BIRTH:  [birth year]  DATE OF PROCEDURE:  11/10/2011  PREOPERATIVE DIAGNOSIS: Partial rotator cuff tear left shoulder with impingement syndrome and acromioclavicular joint arthritis.   POSTOPERATIVE DIAGNOSES:  1. Rotator cuff tear, left shoulder.  2. AC joint arthritis, left shoulder. 3. Impingement syndrome, left shoulder.   PROCEDURES:  1. Arthroscopic rotator cuff repair, left shoulder.  2. Arthroscopic subacromial decompression with resection of coracoacromial ligament, left shoulder.  3. Arthroscopic resection of distal clavicle.   SURGEON: Winn JockJames C. Kyliana Standen, MD  ASSISTANT: None.   ANESTHESIA: General with scalene block.   ESTIMATED BLOOD LOSS: Negligible.   COMPLICATIONS: None.   IMPLANTS USED: One ArthroCare 5.5 mm speed screw.   BRIEF CLINICAL NOTE AND PATHOLOGY: The patient had persistent pain unresponsive to conservative treatment. Work-up showed evidence of a partial rotator cuff tear with AC joint arthritis impingement. At the time of the procedure, there was a near complete tear of the rotator cuff tear. There was marked impingement. No other abnormalities were noted.   DESCRIPTION OF PROCEDURE: Preop antibiotics, adequate general anesthesia after scalene block, beach chair position, routine prepping and draping. Appropriate timeout was called. The arthroscope introduced through routine posterior portal after shoulder injected with Marcaine with epinephrine. The glenohumeral joint was thoroughly inspected. No evidence of instability. The undersurface of the rotator cuff tear was checked and the tear was noted. The scope then passed into the subacromial space where there was marked scarring and thickening of the bursa. A lateral portal was created and the bursa was removed. Subacromial decompression was then performed with a combination of the ArthroWand and the bur using the en bloc technique. The coracoacromial ligament  was released.   The area of the cuff tear was debrided. The original insertion was debrided down to bleeding bone. One suture was placed followed by one anchor which brought the cuff down nicely to its original insertion.   The distal clavicle was then resected from inferior to superior. The dorsal ligaments were protected. The shoulder was taken through a range of motion and there was no evidence of further impingement.   The incisions were closed with nylon. Soft sterile dressing was applied. Patient was awakened, taken to the postanesthesia care unit having tolerated the procedure well.   ____________________________ Winn JockJames C. Gerrit Heckaliff, MD jcc:cms D: 11/10/2011 17:47:55 ET T: 11/11/2011 07:45:09 ET JOB#: [id number]  cc: Winn JockJames C. Gerrit Heckaliff, MD, <Dictator> Winn JockJAMES C Angeliah Wisdom MD ELECTRONICALLY SIGNED 11/19/2011 16:06

## 2015-02-25 ENCOUNTER — Other Ambulatory Visit (INDEPENDENT_AMBULATORY_CARE_PROVIDER_SITE_OTHER): Payer: Managed Care, Other (non HMO)

## 2015-02-25 DIAGNOSIS — E119 Type 2 diabetes mellitus without complications: Secondary | ICD-10-CM | POA: Diagnosis not present

## 2015-02-25 DIAGNOSIS — E785 Hyperlipidemia, unspecified: Secondary | ICD-10-CM | POA: Diagnosis not present

## 2015-02-25 LAB — COMPREHENSIVE METABOLIC PANEL
ALK PHOS: 98 U/L (ref 39–117)
ALT: 21 U/L (ref 0–53)
AST: 16 U/L (ref 0–37)
Albumin: 4 g/dL (ref 3.5–5.2)
BILIRUBIN TOTAL: 0.5 mg/dL (ref 0.2–1.2)
BUN: 15 mg/dL (ref 6–23)
CHLORIDE: 103 meq/L (ref 96–112)
CO2: 27 mEq/L (ref 19–32)
Calcium: 9.3 mg/dL (ref 8.4–10.5)
Creatinine, Ser: 0.86 mg/dL (ref 0.40–1.50)
GFR: 127.02 mL/min (ref >60.00)
Glucose, Bld: 214 mg/dL — ABNORMAL HIGH (ref 70–99)
Potassium: 4.3 mEq/L (ref 3.5–5.1)
Sodium: 135 mEq/L (ref 135–145)
Total Protein: 7.7 g/dL (ref 6.0–8.3)

## 2015-02-25 LAB — LIPID PANEL
CHOL/HDL RATIO: 6
CHOLESTEROL: 201 mg/dL — AB (ref 0–200)
HDL: 34.7 mg/dL — ABNORMAL LOW (ref >39.00)
LDL CALC: 153 mg/dL — AB (ref 0–99)
NONHDL: 166.3
Triglycerides: 67 mg/dL (ref 0.0–149.0)
VLDL: 13.4 mg/dL (ref 0.0–40.0)

## 2015-02-25 LAB — HEMOGLOBIN A1C: Hgb A1c MFr Bld: 9.6 % — ABNORMAL HIGH (ref 4.6–6.5)

## 2015-02-27 ENCOUNTER — Encounter: Payer: Self-pay | Admitting: Family Medicine

## 2015-02-27 ENCOUNTER — Ambulatory Visit (INDEPENDENT_AMBULATORY_CARE_PROVIDER_SITE_OTHER): Payer: Managed Care, Other (non HMO) | Admitting: Family Medicine

## 2015-02-27 VITALS — BP 130/88 | HR 83 | Temp 98.7°F | Wt 217.0 lb

## 2015-02-27 DIAGNOSIS — R112 Nausea with vomiting, unspecified: Secondary | ICD-10-CM

## 2015-02-27 DIAGNOSIS — R1013 Epigastric pain: Secondary | ICD-10-CM | POA: Insufficient documentation

## 2015-02-27 LAB — COMPREHENSIVE METABOLIC PANEL
ALBUMIN: 4.1 g/dL (ref 3.5–5.2)
ALK PHOS: 109 U/L (ref 39–117)
ALT: 44 U/L (ref 0–53)
AST: 21 U/L (ref 0–37)
BUN: 14 mg/dL (ref 6–23)
CALCIUM: 9.1 mg/dL (ref 8.4–10.5)
CO2: 29 meq/L (ref 19–32)
Chloride: 101 mEq/L (ref 96–112)
Creatinine, Ser: 1.43 mg/dL (ref 0.40–1.50)
GFR: 70.63 mL/min (ref >60.00)
GLUCOSE: 212 mg/dL — AB (ref 70–99)
POTASSIUM: 3.9 meq/L (ref 3.5–5.1)
Sodium: 134 mEq/L — ABNORMAL LOW (ref 135–145)
TOTAL PROTEIN: 7.5 g/dL (ref 6.0–8.3)
Total Bilirubin: 0.7 mg/dL (ref 0.2–1.2)

## 2015-02-27 LAB — CBC WITH DIFFERENTIAL/PLATELET
Basophils Absolute: 0 10*3/uL (ref 0.0–0.1)
Basophils Relative: 0.2 % (ref 0.0–3.0)
EOS PCT: 4.1 % (ref 0.0–5.0)
Eosinophils Absolute: 0.2 10*3/uL (ref 0.0–0.7)
HCT: 44.5 % (ref 39.0–52.0)
Hemoglobin: 14.6 g/dL (ref 13.0–17.0)
LYMPHS ABS: 2 10*3/uL (ref 0.7–4.0)
LYMPHS PCT: 36.8 % (ref 12.0–46.0)
MCHC: 32.9 g/dL (ref 30.0–36.0)
MCV: 83.8 fl (ref 78.0–100.0)
MONOS PCT: 10.1 % (ref 3.0–12.0)
Monocytes Absolute: 0.5 10*3/uL (ref 0.1–1.0)
Neutro Abs: 2.6 10*3/uL (ref 1.4–7.7)
Neutrophils Relative %: 48.8 % (ref 43.0–77.0)
Platelets: 214 10*3/uL (ref 150.0–400.0)
RBC: 5.3 Mil/uL (ref 4.22–5.81)
RDW: 12.3 % (ref 11.5–15.5)
WBC: 5.4 10*3/uL (ref 4.0–10.5)

## 2015-02-27 LAB — LIPASE: LIPASE: 17 U/L (ref 11.0–59.0)

## 2015-02-27 NOTE — Progress Notes (Signed)
Subjective:   Patient ID: Luke Abbott, male    DOB: [birth year], 39 y.o.   MRN: [id number]  Luke Abbott is a pleasant 39 y.o. year old male with poorly controlled DM, who presents to clinic today with Abdominal Pain and Emesis  on 02/27/2015  HPI:  Epigastric pain and vomiting- developed epigastric pain yesterday and has vomited twice.  No longer as nauseated but still having epigastric pain.  No diarrhea but did feel gasy and bloated. No one at home or at work has similar symptoms.  He ate a hot dog prior to this happening so he thought it was "just a bad hot dog."  No fevers.  Current Outpatient Prescriptions on File Prior to Visit  Medication Sig Dispense Refill  . fluticasone (FLONASE) 50 MCG/ACT nasal spray Place 2 sprays into both nostrils daily. 16 g 3  . glipiZIDE (GLUCOTROL) 5 MG tablet Take 1 tablet (5 mg total) by mouth 2 (two) times daily before a meal. 60 tablet 5  . glucose blood (ONE TOUCH ULTRA TEST) test strip 1 each by Other route 2 (two) times daily. Use as instructed 50 each 12  . Insulin Pen Needle (BD PEN NEEDLE NANO U/F) 32G X 4 MM MISC Use once daily for insulin administration 30 each 6  . LANTUS SOLOSTAR 100 UNIT/ML Solostar Pen Inject 14 Units into the skin at bedtime. 6 mL 5  . metFORMIN (GLUCOPHAGE) 1000 MG tablet TAKE 1 TABLET BY MOUTH TWICE A DAY 60 tablet 4   No current facility-administered medications on file prior to visit.    No Known Allergies  Past Medical History  Diagnosis Date  . Diabetes mellitus     Past Surgical History  Procedure Laterality Date  . Irrigation and debridement abscess Right 10/05/2013    Procedure: IRRIGATION AND DEBRIDEMENT RIGHT CHEST WALL ABCESS;  Surgeon: Valarie Merino, MD;  Location: [facility];  Service: General;  Laterality: Right;  . Shoulder arthroscopy with rotator cuff repair    . Ganglion cyst excision  1993    wrist    Family History  Problem Relation Age of Onset  . Hypertension Father   .  Kidney disease Mother   . Diabetes Mother     History   Social History  . Marital Status: Legally Separated    Spouse Name: [name]  . Number of Children: 7  . Years of Education: N/A   Occupational History  .  [name]   Social History Main Topics  . Smoking status: Never Smoker   . Smokeless tobacco: Never Used  . Alcohol Use: Yes     Comment: socially  . Drug Use: No  . Sexual Activity: Yes   Other Topics Concern  . Not on file   Social History Narrative   The PMH, PSH, Social History, Family History, Medications, and allergies have been reviewed in [facility], and have been updated if relevant.   Review of Systems  Constitutional: Positive for fatigue. Negative for fever and chills.  Gastrointestinal: Positive for nausea, vomiting, abdominal pain and abdominal distention. Negative for diarrhea, constipation, blood in stool, anal bleeding and rectal pain.  Endocrine: Negative.   Genitourinary: Negative.   Musculoskeletal: Negative.   Skin: Negative.   Neurological: Negative.   Hematological: Negative.   All other systems reviewed and are negative.      Objective:    BP 130/88 mmHg  Pulse 83  Temp(Src) 98.7 F (37.1 C) (Oral)  Wt 217 lb (98.431  kg)  SpO2 98%   Physical Exam  Constitutional: He is oriented to person, place, and time. He appears well-developed and well-nourished. No distress.  HENT:  Head: Normocephalic.  Eyes: Conjunctivae are normal.  Neck: Normal range of motion.  Cardiovascular: Normal rate.   Pulmonary/Chest: Effort normal.  Abdominal: Soft. Bowel sounds are normal. There is tenderness in the epigastric area. There is no rigidity, no rebound and no guarding.  Musculoskeletal: Normal range of motion.  Neurological: He is alert and oriented to person, place, and time. No cranial nerve deficit.  Skin: Skin is warm and dry.  Psychiatric: He has a normal mood and affect. His behavior is normal. Judgment and thought content normal.    Nursing note and vitals reviewed.         Assessment & Plan:   Nausea and vomiting, vomiting of unspecified type - Plan: Lipase, Comprehensive metabolic panel, CBC with Differential/Platelet  Abdominal pain, epigastric - Plan: Lipase, Comprehensive metabolic panel, CBC with Differential/Platelet No Follow-up on file.

## 2015-02-27 NOTE — Progress Notes (Signed)
Pre visit review using our clinic review tool, if applicable. No additional management support is needed unless otherwise documented below in the visit note. 

## 2015-02-27 NOTE — Patient Instructions (Signed)
Good to see you. We will call you with your lab results.  Please go straight to the ER if your symptoms worsen again.

## 2015-02-27 NOTE — Assessment & Plan Note (Signed)
New- with epigastric pain in poorly controlled diabetic. Concerning for early pancreatitis vs gastroenteritis. Discussed this with pt and his wife.  I suggested he either go straight to the ER as pancreatitis is an emergency that will require he go to the hospital or check a stat lipase here first as this certainly good be gastroenteritis.  If he continues to vomit- hold Metformin. The patient indicates understanding of these issues and agrees with the plan.  Orders Placed This Encounter  Procedures  . Lipase  . Comprehensive metabolic panel  . CBC with Differential/Platelet

## 2015-02-28 ENCOUNTER — Encounter: Payer: Self-pay | Admitting: *Deleted

## 2015-03-22 ENCOUNTER — Other Ambulatory Visit: Payer: Self-pay | Admitting: *Deleted

## 2015-03-22 MED ORDER — METFORMIN HCL 1000 MG PO TABS: 1000.0000 mg | ORAL_TABLET | Freq: Two times a day (BID) | ORAL | Status: DC

## 2015-03-26 ENCOUNTER — Ambulatory Visit: Payer: Managed Care, Other (non HMO) | Admitting: Endocrinology

## 2015-03-27 ENCOUNTER — Ambulatory Visit: Payer: Managed Care, Other (non HMO) | Admitting: Endocrinology

## 2015-03-28 ENCOUNTER — Encounter: Payer: Self-pay | Admitting: Endocrinology

## 2015-03-28 ENCOUNTER — Ambulatory Visit (INDEPENDENT_AMBULATORY_CARE_PROVIDER_SITE_OTHER): Payer: Managed Care, Other (non HMO) | Admitting: Endocrinology

## 2015-03-28 VITALS — BP 122/70 | HR 84 | Resp 14 | Ht 72.0 in | Wt 224.5 lb

## 2015-03-28 DIAGNOSIS — E119 Type 2 diabetes mellitus without complications: Secondary | ICD-10-CM

## 2015-03-28 DIAGNOSIS — E785 Hyperlipidemia, unspecified: Secondary | ICD-10-CM

## 2015-03-28 NOTE — Patient Instructions (Signed)
Check sugars 2 x daily ( before breakfast and before supper).  Record them in a log book and bring that/meter to next appointment.   Continue Lantus, Glipizide, and Metformin. Please come back for a follow-up appointment in 2 months.

## 2015-03-28 NOTE — Progress Notes (Signed)
REASON FOR VISIT- Luke Abbott is a 39 y.o.-year-old male, here for follow up management of Type 2 Diabetes Mellitus, uncontrolled, without complications ( Dxed 2007, is on combination of orals and basal insulin).  Last visit April 2016.    Currently taking  - Lantus  16 Units at bedtime ( restart Nov 2015) >>self titrated since last time - Metformin 1000 mg twice daily ( afternoon and night time) - Glipizide   once daily    * Stopped using Januvia due to scratchy throat and expense of medication, around August 2015. * October 2015- tried on Victoza instead of Lantus , but he didn't tolerate even the lower dose 0.6 mg due to GI side effects ( N/diarrhea)- gave him a longer trial on lower dose- but didn't tolerate and went back to taking lantus on how own   Last few hemoglobin A1cs are as follows- Lab Results  Component Value Date   HGBA1C 9.6* 02/25/2015   HGBA1C 8.8* 11/26/2014   HGBA1C 7.3* 08/20/2014   * has been very stressed due to his job , and doesn't find time to do anything else. Is working on leaving the job.    Patient checks his sugars <1 times daily with a  One Touch Ultra glucometer.  By download  his sugars are-  PREMEAL Breakfast Lunch Dinner Bedtime Overall  Glucose range:     120-196  Mean/median:        *Sleepy and tired sometimes and this usually happens when his sugars are high   Hypoglycemia-  No recent symptomatic lows. Lowest sugar was n/a in the morning,  Generally works from 3am to 4 pm. Takes lantus at 7 pm with Glipizide.   ; he has hypoglycemia awareness at 70.   Has been working on his diet and reducing carbs in meals/snacks. Eats 2-three times daily. hasnt done so well recently. 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 Prior positive urine MA in setting of higher sugars, ratio okay.  Lab Results  Component Value Date   BUN 14 02/27/2015   CREATININE 1.43 02/27/2015    Lab Results  Component Value Date   GFR 70.63 02/27/2015    Lab Results  Component Value Date   MICROALBUR 4.4* 08/20/2014   Lab Results  Component Value Date   MICRALBCREAT 2.1 08/20/2014     Last DEE was in Feb 2015>>hasnt been to one this year No numbness and tingling in his feet. No known neuropathy.  No CAD. No history of  prior stroke.   Hyperlipidemia-  Currently on dietary therapy. Somewhat compliant. his last set of lipids were uncontrolled recently-   Lab Results  Component Value Date   CHOL 201* 02/25/2015   HDL 34.70* 02/25/2015   LDLCALC 153* 02/25/2015   TRIG 67.0 02/25/2015   CHOLHDL 6 02/25/2015    BP -  BP well controlled today.   I have reviewed the patient's past medical history, medications and allergies.   Current Outpatient Prescriptions on File Prior to Visit  Medication  Sig Dispense Refill  . fluticasone (FLONASE) 50 MCG/ACT nasal spray Place 2 sprays into both nostrils daily. 16 g 3  . glipiZIDE (GLUCOTROL) 5 MG tablet Take 1 tablet (5 mg total) by mouth 2 (two) times daily before a meal. 60 tablet 5  . glucose blood (ONE TOUCH ULTRA TEST) test strip 1 each by Other route 2 (two) times daily. Use as instructed 50 each 12  . Insulin Pen Needle (BD PEN NEEDLE NANO U/F) 32G X 4 MM MISC Use once daily for insulin administration 30 each 6  . LANTUS SOLOSTAR 100 UNIT/ML Solostar Pen Inject 14 Units into the skin at bedtime. (Patient taking differently: Inject 16 Units into the skin at bedtime. ) 6 mL 5  . metFORMIN (GLUCOPHAGE) 1000 MG tablet Take 1 tablet (1,000 mg total) by mouth 2 (two) times daily. 60 tablet 4    No current facility-administered medications on file prior to visit.    No Known Allergies  Review of Systems- [ x ]  Complains of    [  ]  denies [  ] Recent weight change [  ]  Fatigue [  ] polydipsia [  ] polyuria [  ]  nocturia [  ]  vision difficulty [  ] chest pain [  ] shortness of breath [  ] leg swelling [  ] cough [  ] nausea/vomiting [  ] diarrhea [  ] constipation [  ] abdominal pain [  ]  tingling/numbness in extremities [  ]  concern with feet ( wounds/sores)    PHYSICAL EXAM- BP 122/70 mmHg  Pulse 84  Resp 14  Ht 6' (1.829 m)  Wt 224 lb 8 oz (101.833 kg)  BMI 30.44 kg/m2  SpO2 97% Wt Readings from Last 3 Encounters:  03/28/15 224 lb 8 oz (101.833 kg)  02/27/15 217 lb (98.431 kg)  02/20/15 217 lb (98.431 kg)    Exam: deferred     ASSESSMENT/PLAN- Problem List Items Addressed This Visit      Endocrine   Diabetes mellitus - Primary    Recent A1c not at target, likely from recent stress.  Is thinking of leaving his job- and hopefully after this- he can control his DM better and check his sugars.  Check sugars 2 x daily. Recent sugars are better on current doses. Continue.   Can consider stopping SU and starting SGLt2 at next visits. Currently doesn't want to make any changes.    Reminded him to get eye appt.                Other   Hyperlipidemia    Recent LDL not at goal. Cant follow lifestyle changes recently due to his job. He will work on this for now and perhaps with job change, he might do better. Recheck again in few months and will readdress statin therapy if needed in few months              - RTC 2 months.       Tiffanny Lamarche PUSHKAR 03/31/2015, 10:23 AM

## 2015-03-28 NOTE — Progress Notes (Signed)
Pre visit review using our clinic review tool, if applicable. No additional management support is needed unless otherwise documented below in the visit note. 

## 2015-03-31 NOTE — Assessment & Plan Note (Signed)
Recent A1c not at target, likely from recent stress.  Is thinking of leaving his job- and hopefully after this- he can control his DM better and check his sugars.  Check sugars 2 x daily. Recent sugars are better on current doses. Continue.   Can consider stopping SU and starting SGLt2 at next visits. Currently doesn't want to make any changes.    Reminded him to get eye appt.

## 2015-03-31 NOTE — Assessment & Plan Note (Signed)
Recent LDL not at goal. Cant follow lifestyle changes recently due to his job. He will work on this for now and perhaps with job change, he might do better. Recheck again in few months and will readdress statin therapy if needed in few months

## 2015-04-24 ENCOUNTER — Encounter: Payer: Self-pay | Admitting: Primary Care

## 2015-04-24 ENCOUNTER — Ambulatory Visit (INDEPENDENT_AMBULATORY_CARE_PROVIDER_SITE_OTHER): Payer: Managed Care, Other (non HMO) | Admitting: Primary Care

## 2015-04-24 VITALS — BP 132/76 | HR 92 | Temp 98.6°F | Ht 72.0 in | Wt 222.4 lb

## 2015-04-24 DIAGNOSIS — M25561 Pain in right knee: Secondary | ICD-10-CM

## 2015-04-24 NOTE — Patient Instructions (Signed)
There is no dislocation of your knee cap. Your tendons do not appear to be weak.  Rest, apply ice, obtain a knee brace for compression, and elevate your knee when sitting.  You may take 600 mg to 800 mg of ibuprofen three times daily as needed for pain and inflammation.  It was very nice meeting you!  RICE: Routine Care for Injuries The routine care of many injuries includes Rest, Ice, Compression, and Elevation (RICE). HOME CARE INSTRUCTIONS  Rest is needed to allow your body to heal. Routine activities can usually be resumed when comfortable. Injured tendons and bones can take up to 6 weeks to heal. Tendons are the cord-like structures that attach muscle to bone.  Ice following an injury helps keep the swelling down and reduces pain.  Put ice in a plastic bag.  Place a towel between your skin and the bag.  Leave the ice on for 15-20 minutes, 3-4 times a day, or as directed by your health care provider. Do this while awake, for the first 24 to 48 hours. After that, continue as directed by your caregiver.  Compression helps keep swelling down. It also gives support and helps with discomfort. If an elastic bandage has been applied, it should be removed and reapplied every 3 to 4 hours. It should not be applied tightly, but firmly enough to keep swelling down. Watch fingers or toes for swelling, bluish discoloration, coldness, numbness, or excessive pain. If any of these problems occur, remove the bandage and reapply loosely. Contact your caregiver if these problems continue.  Elevation helps reduce swelling and decreases pain. With extremities, such as the arms, hands, legs, and feet, the injured area should be placed near or above the level of the heart, if possible. SEEK IMMEDIATE MEDICAL CARE IF:  You have persistent pain and swelling.  You develop redness, numbness, or unexpected weakness.  Your symptoms are getting worse rather than improving after several days. These symptoms may  indicate that further evaluation or further X-rays are needed. Sometimes, X-rays may not show a small broken bone (fracture) until 1 week or 10 days later. Make a follow-up appointment with your caregiver. Ask when your X-ray results will be ready. Make sure you get your X-ray results. Document Released: 01/31/2001 Document Revised: 10/24/2013 Document Reviewed: 03/20/2011 [facility] Patient Information 2015 [place], [place]. This information is not intended to replace advice given to you by your health care provider. Make sure you discuss any questions you have with your health care provider.

## 2015-04-24 NOTE — Progress Notes (Signed)
Subjective:    Patient ID: Luke Abbott, male    DOB: 1976-02-26, 39 y.o.   MRN: [id number]  HPI  Luke Abbott is a 39 year old male who presents today with a chief complaint of knee pain. His pain is located to his right knee has been present since  last night. He was wrestling with his kids and fell into his dresser hitting his right knee on accident. He immediatley was able to get up and walk around but reported pain last night and this morning. He reports "locking" up and tightness to his right lateral patella when resting for a prolongned amount of time. His pain is improved with walking but continues to experience soreness. He's tried applying ice with some relief. He's not taken anything orally for pain.  Review of Systems  Musculoskeletal: Positive for arthralgias. Negative for joint swelling.  Neurological: Negative for dizziness and syncope.       Past Medical History  Diagnosis Date  . Diabetes mellitus     History   Social History  . Marital Status: Legally Separated    Spouse Name: [name]  . Number of Children: 7  . Years of Education: N/A   Occupational History  .  [name]   Social History Main Topics  . Smoking status: Never Smoker   . Smokeless tobacco: Never Used  . Alcohol Use: Yes     Comment: socially  . Drug Use: No  . Sexual Activity: Yes   Other Topics Concern  . Not on file   Social History Narrative    Past Surgical History  Procedure Laterality Date  . Irrigation and debridement abscess Right 10/05/2013    Procedure: IRRIGATION AND DEBRIDEMENT RIGHT CHEST WALL ABCESS;  Surgeon: Valarie Merino, MD;  Location: [facility];  Service: General;  Laterality: Right;  . Shoulder arthroscopy with rotator cuff repair    . Ganglion cyst excision  1993    wrist    Family History  Problem Relation Age of Onset  . Hypertension Father   . Kidney disease Mother   . Diabetes Mother     No Known Allergies  Current Outpatient Prescriptions  on File Prior to Visit  Medication Sig Dispense Refill  . glipiZIDE (GLUCOTROL) 5 MG tablet Take 1 tablet (5 mg total) by mouth 2 (two) times daily before a meal. 60 tablet 5  . glucose blood (ONE TOUCH ULTRA TEST) test strip 1 each by Other route 2 (two) times daily. Use as instructed 50 each 12  . Insulin Pen Needle (BD PEN NEEDLE NANO U/F) 32G X 4 MM MISC Use once daily for insulin administration 30 each 6  . LANTUS SOLOSTAR 100 UNIT/ML Solostar Pen Inject 14 Units into the skin at bedtime. (Patient taking differently: Inject 16 Units into the skin at bedtime. ) 6 mL 5  . metFORMIN (GLUCOPHAGE) 1000 MG tablet Take 1 tablet (1,000 mg total) by mouth 2 (two) times daily. 60 tablet 4   No current facility-administered medications on file prior to visit.    BP 132/76 mmHg  Pulse 92  Temp(Src) 98.6 F (37 C) (Oral)  Ht 6' (1.829 m)  Wt 222 lb 6.4 oz (100.88 kg)  BMI 30.16 kg/m2  SpO2 98%    Objective:   Physical Exam  Constitutional: He appears well-nourished.  Cardiovascular: Normal rate and regular rhythm.   Pulmonary/Chest: Effort normal and breath sounds normal.  Musculoskeletal:  Tenderness to right lateral patella. Negative lachman and drawer  test. Patella stable. No bruising or wound present. Ambulatory with slight limp  Skin: Skin is warm and dry.          Assessment & Plan:  Knee pain:  Status post fall yesterday. No edema or wounds present.  Negative lachman and drawer test. Patella stable. Ambulatory. Stable, unremarkable exam. RICE. Ibuprofen PRN pain. Follow up if no improvement.

## 2015-04-24 NOTE — Progress Notes (Signed)
Pre visit review using our clinic review tool, if applicable. No additional management support is needed unless otherwise documented below in the visit note. 

## 2015-05-03 ENCOUNTER — Ambulatory Visit (INDEPENDENT_AMBULATORY_CARE_PROVIDER_SITE_OTHER): Payer: Managed Care, Other (non HMO) | Admitting: Family Medicine

## 2015-05-03 ENCOUNTER — Encounter: Payer: Self-pay | Admitting: Family Medicine

## 2015-05-03 VITALS — BP 104/68 | HR 93 | Temp 98.9°F | Wt 220.8 lb

## 2015-05-03 DIAGNOSIS — T24201A Burn of second degree of unspecified site of right lower limb, except ankle and foot, initial encounter: Secondary | ICD-10-CM | POA: Diagnosis not present

## 2015-05-03 MED ORDER — SILVER SULFADIAZINE 1 % EX CREA: 1.0000 "application " | TOPICAL_CREAM | Freq: Every day | CUTANEOUS | Status: DC

## 2015-05-03 MED ORDER — LANTUS SOLOSTAR 100 UNIT/ML ~~LOC~~ SOPN: 16.0000 [IU] | PEN_INJECTOR | Freq: Every day | SUBCUTANEOUS | Status: DC
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
## 2015-05-03 NOTE — Patient Instructions (Addendum)
Keep working on your sugar.   Silvadene cream, nonstick bandage, roll gauze.  Should gradually heal over.  Take care.  Glad to see you.

## 2015-05-06 DIAGNOSIS — T24201A Burn of second degree of unspecified site of right lower limb, except ankle and foot, initial encounter: Secondary | ICD-10-CM | POA: Insufficient documentation

## 2015-05-06 NOTE — Assessment & Plan Note (Signed)
Cover with silvadene and nonstick bandage, roll gauze over that.  Advised patient to leave the blister intact, with routine f/u instructions.  He agrees.

## 2015-05-15 ENCOUNTER — Ambulatory Visit: Payer: Managed Care, Other (non HMO) | Admitting: Endocrinology

## 2015-07-12 ENCOUNTER — Ambulatory Visit (INDEPENDENT_AMBULATORY_CARE_PROVIDER_SITE_OTHER): Payer: Managed Care, Other (non HMO) | Admitting: Family Medicine

## 2015-07-12 ENCOUNTER — Encounter: Payer: Self-pay | Admitting: Family Medicine

## 2015-07-12 VITALS — BP 110/78 | HR 92 | Temp 98.8°F | Ht 72.0 in | Wt 217.2 lb

## 2015-07-12 DIAGNOSIS — R112 Nausea with vomiting, unspecified: Secondary | ICD-10-CM | POA: Diagnosis not present

## 2015-07-12 DIAGNOSIS — M25511 Pain in right shoulder: Secondary | ICD-10-CM

## 2015-07-12 DIAGNOSIS — M25519 Pain in unspecified shoulder: Secondary | ICD-10-CM | POA: Insufficient documentation

## 2015-07-12 DIAGNOSIS — R1110 Vomiting, unspecified: Secondary | ICD-10-CM | POA: Insufficient documentation

## 2015-07-12 NOTE — Assessment & Plan Note (Signed)
Likely cuff irritation.  D/w pt about anatomy and treatment.  Would use home exercises, ibuprofen prn.  Handout given, understood.  He'll update PCP prn, may need to see [facility] if not better.

## 2015-07-12 NOTE — Progress Notes (Signed)
Pre visit review using our clinic review tool, if applicable. No additional management support is needed unless otherwise documented below in the visit note.  H/o L shoulder surgery prev.  Now with R shoulder pain.  Started about 4 months ago.  More pain recently.  Working early 1st shift.  Was out of work today.  Has iced his shoulder in the meantime. Can still sleep well.  Pain reaching up for objects.  No trauma.  Lifting at work.   Grip is still good.  No neck pain.    GI upset recently.  Vomiting and diarrhea x1 day.  No fevers.  No one else is sick at home.  No fevers.  Last vomited last night.  Last UOP was this AM, not dark.  Stomach feels better now.  Prev bloating resolved.    Meds, vitals, and allergies reviewed.   ROS: See HPI.  Otherwise, noncontributory.  nad ncat mmm Neck supple, no LA B shoulders w/o arm drop but pain with int/ext rotation R shoulder, pain on supraspinatus testing.  + impingement.  + scap assist.  Distally NV intact rrr ctab abd soft, not ttp

## 2015-07-12 NOTE — Assessment & Plan Note (Signed)
Now resolved, benign exam, okay for outpatient fu.  This could have been an incidental issue that has resolved.  It he did have AGE, it was mild and is apparently resolved.  Still well appearing.  Okay for outpatient f/u.

## 2015-07-12 NOTE — Patient Instructions (Signed)
Use the exercise and take ibuprofen with food.   Update Dr. Dayton Martes if not better.  Take care.  Glad to see you.

## 2015-09-04 ENCOUNTER — Encounter: Payer: Self-pay | Admitting: Primary Care

## 2015-09-04 ENCOUNTER — Ambulatory Visit (INDEPENDENT_AMBULATORY_CARE_PROVIDER_SITE_OTHER): Payer: Managed Care, Other (non HMO) | Admitting: Primary Care

## 2015-09-04 ENCOUNTER — Ambulatory Visit (INDEPENDENT_AMBULATORY_CARE_PROVIDER_SITE_OTHER)
Admission: RE | Admit: 2015-09-04 | Discharge: 2015-09-04 | Disposition: A | Discharge status: Home / Self Care | Payer: Managed Care, Other (non HMO) | Source: Ambulatory Visit | Attending: Primary Care | Admitting: Primary Care

## 2015-09-04 VITALS — BP 128/82 | HR 87 | Temp 98.5°F | Ht 72.0 in | Wt 215.0 lb

## 2015-09-04 DIAGNOSIS — M25511 Pain in right shoulder: Secondary | ICD-10-CM

## 2015-09-04 DIAGNOSIS — R112 Nausea with vomiting, unspecified: Secondary | ICD-10-CM

## 2015-09-04 NOTE — Patient Instructions (Signed)
Please notify me if your nausea and vomiting returns, and if you would like for me to provide you any nausea medication.  Complete xray(s) prior to leaving today. I will contact you regarding your results.  [facility] by the front [facility] with Dr. Patsy Lageropland.  It was a pleasure meeting you!

## 2015-09-04 NOTE — Progress Notes (Signed)
Subjective:    Patient ID: Luke Abbott, male    DOB: 1976/01/20, 39 y.o.   MRN: [id number]  HPI  Luke Abbott is a 39 year old male who presents today with multiple complaints.  1) Nausea and vomiting: His symptoms began Monday morning with fatigue and weakness. He vomited once Monday after eating and again once on Tuesday after drinking.  He's eaten and taken in oral fluids early this morning without vomiting or abdominal pain. Overall he's feeling tired and weak, but better than Tuesday. Denies abdominal pain, diarrhea, body aches.  2) Shoulder pain: Located to the right shoulder and has been ongoing since early summer. Evaluated on 07/12/15 for same and determined to likely be cuff irritation. He was provided with home exercises and supportive measures. He tried the exercises and had temporary relief initially, but now believes his pain is worse. He works as a Financial planner heavy objects daily. He has a history of left shoulder arthroscopy several years ago. He's not had imaging of shoulder.  Review of Systems  Constitutional: Positive for fever and chills.  Gastrointestinal: Positive for nausea and vomiting. Negative for abdominal pain and diarrhea.  Musculoskeletal: Positive for arthralgias. Negative for joint swelling.       Past Medical History  Diagnosis Date  . Diabetes mellitus     Social History   Social History  . Marital Status: Legally Separated    Spouse Name: [name]  . Number of Children: 7  . Years of Education: N/A   Occupational History  .  [name]   Social History Main Topics  . Smoking status: Never Smoker   . Smokeless tobacco: Never Used  . Alcohol Use: Yes     Comment: socially  . Drug Use: No  . Sexual Activity: Yes   Other Topics Concern  . Not on file   Social History Narrative    Past Surgical History  Procedure Laterality Date  . Irrigation and debridement abscess Right 10/05/2013    Procedure: IRRIGATION AND  DEBRIDEMENT RIGHT CHEST WALL ABCESS;  Surgeon: Valarie Merino, MD;  Location: [facility];  Service: General;  Laterality: Right;  . Shoulder arthroscopy with rotator cuff repair    . Ganglion cyst excision  1993    wrist    Family History  Problem Relation Age of Onset  . Hypertension Father   . Kidney disease Mother   . Diabetes Mother     No Known Allergies  Current Outpatient Prescriptions on File Prior to Visit  Medication Sig Dispense Refill  . glipiZIDE (GLUCOTROL) 5 MG tablet Take 1 tablet (5 mg total) by mouth 2 (two) times daily before a meal. 60 tablet 5  . glucose blood (ONE TOUCH ULTRA TEST) test strip 1 each by Other route 2 (two) times daily. Use as instructed 50 each 12  . Insulin Pen Needle (BD PEN NEEDLE NANO U/F) 32G X 4 MM MISC Use once daily for insulin administration 30 each 6  . LANTUS SOLOSTAR 100 UNIT/ML Solostar Pen Inject 16 Units into the skin at bedtime.    . metFORMIN (GLUCOPHAGE) 1000 MG tablet Take 1 tablet (1,000 mg total) by mouth 2 (two) times daily. 60 tablet 4   No current facility-administered medications on file prior to visit.    BP 128/82 mmHg  Pulse 87  Temp(Src) 98.5 F (36.9 C) (Oral)  Ht 6' (1.829 m)  Wt 215 lb (97.523 kg)  BMI 29.15 kg/m2  SpO2  97%    Objective:   Physical Exam  Constitutional: He appears well-nourished.  Cardiovascular: Normal rate and regular rhythm.   Pulmonary/Chest: Effort normal and breath sounds normal.  Abdominal: Soft. Bowel sounds are normal. There is no tenderness.  Musculoskeletal:       Right shoulder: He exhibits decreased range of motion and pain. He exhibits no tenderness, no bony tenderness, no swelling and no crepitus.  Pain with abduction of extremity. Non tender, no erythema.  Skin: Skin is warm and dry.          Assessment & Plan:

## 2015-09-04 NOTE — Assessment & Plan Note (Signed)
Vomiting twice since Monday. No diarrhea. Likely viral GI illness. Tolerating solids and fluids well over the past 12 hours. Exam unremarkable without tenderness. Vitals stable. Discussed supportive treatment and to notify us if symptoms return.

## 2015-09-04 NOTE — Progress Notes (Signed)
Pre visit review using our clinic review tool, if applicable. No additional management support is needed unless otherwise documented below in the visit note. 

## 2015-09-04 NOTE — Assessment & Plan Note (Signed)
Pain continues to right shoulder despite supportive measures. Pain with ROM, especially with abduction. No swelling or erythema. Will obtain xray today, suspect he will need MRI eventually. Also sent him to Dr. Patsy Lageropland for further evaluation and possible joint injections. Discussed supportive measures. Declines pain medication.

## 2015-09-05 ENCOUNTER — Telehealth: Payer: Self-pay | Admitting: Family Medicine

## 2015-09-05 NOTE — Telephone Encounter (Signed)
Pt returned call to CMA, read Luke GunningKate Clark's notes to pt, he did not have any questions.  He will discuss with Dr. Patsy Lageropland next week /lt

## 2015-09-09 ENCOUNTER — Ambulatory Visit (INDEPENDENT_AMBULATORY_CARE_PROVIDER_SITE_OTHER): Payer: Managed Care, Other (non HMO) | Admitting: Family Medicine

## 2015-09-09 ENCOUNTER — Encounter: Payer: Self-pay | Admitting: Family Medicine

## 2015-09-09 VITALS — BP 118/86 | HR 79 | Temp 98.7°F | Ht 72.0 in | Wt 216.5 lb

## 2015-09-09 DIAGNOSIS — M75101 Unspecified rotator cuff tear or rupture of right shoulder, not specified as traumatic: Secondary | ICD-10-CM | POA: Diagnosis not present

## 2015-09-09 DIAGNOSIS — M7551 Bursitis of right shoulder: Secondary | ICD-10-CM | POA: Diagnosis not present

## 2015-09-09 DIAGNOSIS — M7581 Other shoulder lesions, right shoulder: Secondary | ICD-10-CM

## 2015-09-09 DIAGNOSIS — E1165 Type 2 diabetes mellitus with hyperglycemia: Secondary | ICD-10-CM | POA: Diagnosis not present

## 2015-09-09 DIAGNOSIS — M7541 Impingement syndrome of right shoulder: Secondary | ICD-10-CM

## 2015-09-09 DIAGNOSIS — IMO0001 Reserved for inherently not codable concepts without codable children: Secondary | ICD-10-CM

## 2015-09-09 MED ORDER — METHYLPREDNISOLONE ACETATE 40 MG/ML IJ SUSP
80.0000 mg | Freq: Once | INTRAMUSCULAR | Status: AC
  Administered 2015-09-09: 80 mg via INTRA_ARTICULAR

## 2015-09-09 NOTE — Progress Notes (Signed)
Dr. Karleen HampshireSpencer T. Harriet Sutphen, MD, CAQ Sports Medicine Primary [facility] and Sports Medicine [address] [place], [id number] Phone: [phone] Fax: [phone]  09/09/2015  Patient: Luke Abbott, MRN: [id number], DOB: Jan 11, 1976, 39 y.o.  Primary Physician:  Ruthe Mannanalia Aron, MD   Chief Complaint  Patient presents with  . Shoulder Pain    Right   Subjective:   This 39 y.o. male patient noted above presents with R shoulder pain that has been ongoing for 6 mo there is no history of trauma or accident recently The patient denies neck pain or radicular symptoms. Denies dislocation, subluxation, separation of the shoulder. The patient does complain of pain in the overhead plane with significant painful arc of motion.  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   L shoulder [facility]. [facility].   Lab Results  Component Value Date   HGBA1C 9.6* 02/25/2015   Medications Tried: Tylenol, NSAIDS Ice or Heat: minimally helpful Tried PT: No  Prior shoulder Injury: L Prior surgery: L shoulder arthroscopy 2013, [facility], op note not available Prior fracture: No  The PMH, PSH, Social History, Family History, Medications, and allergies have been reviewed [facility], and have been updated if relevant.  Patient Active Problem List   Diagnosis Date Noted  . Pain in joint, shoulder region 07/12/2015  . Vomiting 07/12/2015  . Nausea with vomiting 02/27/2015  . Abdominal pain, epigastric 02/27/2015  . Low back pain 10/01/2014  . Hyperlipidemia 08/21/2014  . Pre-syncope 05/25/2014  . Diabetes mellitus type 2, uncontrolled, without complications [facility](HCC)     Past Medical History  Diagnosis Date  . Diabetes mellitus     Past Surgical History  Procedure Laterality Date  . Irrigation and debridement abscess Right 10/05/2013    Procedure: IRRIGATION AND DEBRIDEMENT RIGHT CHEST WALL ABCESS;   Surgeon: Valarie MerinoMatthew B Martin, MD;  Location: [facility];  Service: General;  Laterality: Right;  . Shoulder arthroscopy with rotator cuff repair    . Ganglion cyst excision  1993    wrist    Social History   Social History  . Marital Status: Legally Separated    Spouse Name: [name]  . Number of Children: 7  . Years of Education: N/A   Occupational History  .  [employer]   Social History Main Topics  . Smoking status: Never Smoker   . Smokeless tobacco: Never Used  . Alcohol Use: Yes     Comment: socially  . Drug Use: No  . Sexual Activity: Yes   Other Topics Concern  . Not on file   Social History Narrative    Family History  Problem Relation Age of Onset  . Hypertension Father   . Kidney disease Mother   . Diabetes Mother     No Known Allergies  Medication list reviewed and updated in full in [facility].  GEN: No fevers, chills. Nontoxic. Primarily MSK c/o today. MSK: Detailed in the HPI GI: tolerating PO intake without difficulty Neuro: No numbness, parasthesias, or tingling associated. Otherwise the pertinent positives of the ROS are noted above.   Objective:   Blood pressure 118/86, pulse 79, temperature 98.7 F (37.1 C), temperature source Oral, height 6' (1.829 m), weight 216 lb 8 oz (98.204 kg).  GEN: Well-developed,well-nourished,in no acute distress; alert,appropriate and cooperative throughout examination HEENT: Normocephalic and atraumatic without obvious abnormalities.  Ears, externally no deformities PULM: Breathing comfortably in no respiratory distress EXT: No clubbing, cyanosis, or edema PSYCH: Normally interactive. Cooperative during the interview. Pleasant. Friendly and conversant. Not anxious or depressed appearing. Normal, full affect.  Shoulder: R Inspection: No muscle wasting or winging Ecchymosis/edema: neg  AC joint, scapula, clavicle: NT Cervical spine: NT, full ROM Spurling's: neg Abduction: full, 5/5 Flexion: full,  5/5 IR, full, lift-off: 5/5 ER at neutral: full, 5/5 AC crossover: neg Neer: pos Hawkins: pos Drop Test: neg Empty Can: pos Supraspinatus insertion: mild-mod T Bicipital groove: NT Speed's: neg Yergason's: neg Sulcus sign: neg Scapular dyskinesis: none C5-T1 intact  Neuro: Sensation intact Grip 5/5   Radiology: Dg Shoulder Right  09/04/2015  CLINICAL DATA:  Right shoulder pain for 6 months.  No known injury. EXAM: RIGHT SHOULDER - 2+ VIEW COMPARISON:  None. FINDINGS: Degenerative changes in the right [facility] joint with joint space narrowing and subclavicular spurring. Glenohumeral joint is intact. No acute bony abnormality. Specifically, no fracture, subluxation, or dislocation. Soft tissues are intact. 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 No acute bony abnormality. Electronically Signed   By: Charlett Nose M.D.   On: 09/04/2015 12:42   Assessment and Plan:    Rotator cuff impingement syndrome of right shoulder - Plan: Ambulatory referral to Physical Therapy, methylPREDNISolone acetate (DEPO-MEDROL) injection 80 mg  Subacromial bursitis, right - Plan: Ambulatory referral to Physical Therapy, methylPREDNISolone acetate (DEPO-MEDROL) injection 80 mg  Rotator cuff tendinitis, right - Plan: Ambulatory referral to Physical Therapy, methylPREDNISolone acetate (DEPO-MEDROL) injection 80 mg  Rotator cuff strengthening and scapular stabilization exercises were reviewed with the patient.  [facility] RTC and scapular stabilization program or MOON shoulder protocol given to the patient. Retraining shoulder mechanics and function was emphasized to the patient with rehab done at least 5-6 days a week.  The patient could benefit from formal PT to assist with scapular stabilization and RTC strengthening - which I made, then the patient cancelled.  SubAC Injection, R Verbal consent was obtained from the patient. Risks (including rare infection), benefits, and alternatives were  explained. Patient prepped with Chloraprep and Ethyl Chloride used for anesthesia. The subacromial space was injected using the posterior approach. The patient tolerated the procedure well and had decreased pain post injection. No complications. Injection: 8 cc of Lidocaine 1% and 2 mL of Depo-Medrol 40 mg. Needle: 22 gauge    Follow-up: Return in about 6 weeks (around 10/21/2015).  New Prescriptions   No medications on file   Modified Medications   No medications on file   Orders Placed This Encounter  Procedures  . Ambulatory referral to Physical Therapy    Signed,  Karleen Hampshire T. Aleaha Fickling, MD   Patient's Medications  New Prescriptions   No medications on file  Previous Medications   GLIPIZIDE (GLUCOTROL) 5 MG TABLET    Take 1 tablet (5 mg total) by mouth 2 (two) times daily before a meal.   GLUCOSE BLOOD (ONE TOUCH ULTRA TEST) TEST STRIP    1 each by Other route 2 (two) times daily. Use as instructed   INSULIN PEN NEEDLE (BD PEN NEEDLE NANO U/F) 32G X 4 MM MISC    Use once daily for insulin administration   LANTUS SOLOSTAR 100 UNIT/ML SOLOSTAR PEN    Inject 16 Units into the skin at bedtime.   METFORMIN (GLUCOPHAGE) 1000 MG TABLET    Take 1 tablet (1,000 mg total) by mouth 2 (two) times daily.  Modified Medications   No medications  on file  Discontinued Medications   No medications on file

## 2015-09-09 NOTE — Progress Notes (Signed)
Pre visit review using our clinic review tool, if applicable. No additional management support is needed unless otherwise documented below in the visit note. 

## 2015-09-09 NOTE — Patient Instructions (Signed)

## 2015-09-10 ENCOUNTER — Ambulatory Visit: Payer: Managed Care, Other (non HMO) | Admitting: Family Medicine

## 2015-09-10 ENCOUNTER — Encounter: Payer: Self-pay | Admitting: *Deleted

## 2015-09-11 ENCOUNTER — Ambulatory Visit (INDEPENDENT_AMBULATORY_CARE_PROVIDER_SITE_OTHER): Payer: Managed Care, Other (non HMO) | Admitting: Family Medicine

## 2015-09-11 ENCOUNTER — Encounter: Payer: Self-pay | Admitting: Family Medicine

## 2015-09-11 VITALS — BP 128/80 | HR 91 | Temp 98.7°F | Ht 72.0 in | Wt 213.5 lb

## 2015-09-11 DIAGNOSIS — M7551 Bursitis of right shoulder: Secondary | ICD-10-CM

## 2015-09-11 DIAGNOSIS — M75101 Unspecified rotator cuff tear or rupture of right shoulder, not specified as traumatic: Secondary | ICD-10-CM | POA: Diagnosis not present

## 2015-09-11 DIAGNOSIS — M7581 Other shoulder lesions, right shoulder: Secondary | ICD-10-CM

## 2015-09-11 DIAGNOSIS — M7541 Impingement syndrome of right shoulder: Secondary | ICD-10-CM

## 2015-09-11 NOTE — Progress Notes (Signed)
Pre visit review using our clinic review tool, if applicable. No additional management support is needed unless otherwise documented below in the visit note. 

## 2015-09-11 NOTE — Progress Notes (Signed)
Dr. Karleen Hampshire T. Veola Cafaro, MD, CAQ Sports Medicine Primary Care and Sports Medicine [address] [place], [address] Phone: [phone] Fax: [phone]  09/11/2015  Patient: Luke Abbott, MRN: [id number], DOB: 24-Mar-1976, 39 y.o.  Primary Physician:  [name], MD   Chief Complaint  Patient presents with  . Shoulder Pain    Right   Subjective:   F/u R shoulder:   I actually saw this patient 2 days ago.  Monday, worsened pain, post injection when lidocaine wore off. He received a standard subac injection, but i did use a 21 gauge needle, 2 inch given size of his shoulder.    Has a manual car.  Ice, heat, and he could not get comfortable at all.  He has the rest of the week off from work.   Drives a Presenter, broadcasting for work.  "Needs FMLA paperwork filled out."  09/09/2015 Last OV with [name], MD  This 39 y.o. male patient noted above presents with R shoulder pain that has been ongoing for 6 mo there is no history of trauma or accident recently The patient denies neck pain or radicular symptoms. Denies dislocation, subluxation, separation of the shoulder. The patient does complain of pain in the overhead plane with significant painful arc of motion.  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   L shoulder surgery. 2013 at [facility].   Lab Results  Component Value Date   HGBA1C 9.6* 02/25/2015   Medications Tried: Tylenol, NSAIDS Ice or Heat: minimally helpful Tried PT: No  Prior shoulder Injury: L Prior surgery: L shoulder arthroscopy 2013, [facility], op note not available Prior fracture: No  The PMH, PSH, Social History, Family History, Medications, and allergies have been reviewed in [facility], and have been updated if relevant.  Patient Active Problem List   Diagnosis Date Noted  . Pain in joint, shoulder region 07/12/2015  . Vomiting 07/12/2015  . Nausea  with vomiting 02/27/2015  . Abdominal pain, epigastric 02/27/2015  . Low back pain 10/01/2014  . Hyperlipidemia 08/21/2014  . Pre-syncope 05/25/2014  . Diabetes mellitus type 2, uncontrolled, without complications [facility])     Past Medical History  Diagnosis Date  . Diabetes mellitus     Past Surgical History  Procedure Laterality Date  . Irrigation and debridement abscess Right 10/05/2013    Procedure: IRRIGATION AND DEBRIDEMENT RIGHT CHEST WALL ABCESS;  Surgeon: Valarie Merino, MD;  Location: [facility];  Service: General;  Laterality: Right;  . Shoulder arthroscopy with rotator cuff repair    . Ganglion cyst excision  1993    wrist    Social History   Social History  . Marital Status: Legally Separated    Spouse Name: [name]  . Number of Children: 7  . Years of Education: N/A   Occupational History  .  [name]   Social History Main Topics  . Smoking status: Never Smoker   . Smokeless tobacco: Never Used  . Alcohol Use: Yes     Comment: socially  . Drug Use: No  . Sexual Activity: Yes   Other Topics Concern  . Not on file   Social History Narrative    Family History  Problem Relation Age of Onset  . Hypertension Father   . Kidney disease Mother   . Diabetes Mother     No Known Allergies  Medication list reviewed and updated in full in [facility].  GEN: No fevers, chills. Nontoxic. Primarily MSK c/o today. MSK: Detailed in the HPI GI: tolerating PO intake without difficulty Neuro: No numbness, parasthesias, or tingling associated. Otherwise the pertinent positives of the ROS are noted above.   Objective:   Blood pressure 128/80, pulse 91, temperature 98.7 F (37.1 C), temperature source Oral, height 6' (1.829 m), weight 213 lb 8 oz (96.843 kg).  GEN: Well-developed,well-nourished,in no acute distress; alert,appropriate and cooperative throughout examination HEENT: Normocephalic and atraumatic without obvious abnormalities. Ears,  externally no deformities PULM: Breathing comfortably in no respiratory distress EXT: No clubbing, cyanosis, or edema PSYCH: Normally interactive. Cooperative during the interview. Pleasant. Friendly and conversant. Not anxious or depressed appearing. Normal, full affect.  Shoulder: R Inspection: No muscle wasting or winging Ecchymosis/edema: neg  AC joint, scapula, clavicle: NT Cervical spine: NT, full ROM Spurling's: neg Abduction: full, 5/5 Flexion: full, 5/5 IR, full, lift-off: 5/5 ER at neutral: full, 5/5 AC crossover: neg Neer: pos Hawkins: pos Drop Test: neg Empty Can: pos Supraspinatus insertion: mild-mod T Bicipital groove: NT Speed's: neg Yergason's: neg Sulcus sign: neg Scapular dyskinesis: none C5-T1 intact  Neuro: Sensation intact Grip 5/5   Radiology: Dg Shoulder Right  09/04/2015  CLINICAL DATA:  Right shoulder pain for 6 months.  No known injury. EXAM: RIGHT SHOULDER - 2+ VIEW COMPARISON:  None. FINDINGS: Degenerative changes in the right [facility] joint with joint space narrowing and subclavicular spurring. Glenohumeral joint is intact. No acute bony abnormality. Specifically, no fracture, subluxation, or dislocation. Soft tissues are intact. 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 No acute bony abnormality. Electronically Signed   By: Charlett NoseKevin  Dover M.D.   On: 09/04/2015 12:42   Assessment and Plan:    Rotator cuff impingement syndrome of right shoulder  Subacromial bursitis, right  Rotator cuff tendinitis, right  Most likely, this is post-injection pain, and it has been less than 48 hours since I saw him. Corticosteroid injection has not had time to start working.   Notably, the patient cancelled and declined my recommendation for formal PT assistance of RTC training and scapular stablization.   He requests FMLA paperwork to be completed.   Recheck on Monday prior to his return to work.   Follow-up: Return for recheck with Dr. Patsy Lageropland on  Monday.  Signed,  Elpidio GaleaSpencer T. Ailea Rhatigan, MD   Patient's Medications  New Prescriptions   No medications on file  Previous Medications   GLIPIZIDE (GLUCOTROL) 5 MG TABLET    Take 1 tablet (5 mg total) by mouth 2 (two) times daily before a meal.   GLUCOSE BLOOD (ONE TOUCH ULTRA TEST) TEST STRIP    1 each by Other route 2 (two) times daily. Use as instructed   INSULIN PEN NEEDLE (BD PEN NEEDLE NANO U/F) 32G X 4 MM MISC    Use once daily for insulin administration   LANTUS SOLOSTAR 100 UNIT/ML SOLOSTAR PEN    Inject 16 Units into the skin at bedtime.   METFORMIN (GLUCOPHAGE) 1000 MG TABLET    Take 1 tablet (1,000 mg total) by mouth 2 (two) times daily.  Modified Medications   No medications on file  Discontinued Medications   No medications on file

## 2015-09-16 ENCOUNTER — Ambulatory Visit (INDEPENDENT_AMBULATORY_CARE_PROVIDER_SITE_OTHER): Payer: Managed Care, Other (non HMO) | Admitting: Family Medicine

## 2015-09-16 ENCOUNTER — Encounter: Payer: Self-pay | Admitting: Family Medicine

## 2015-09-16 VITALS — BP 124/72 | HR 102 | Temp 98.5°F | Ht 72.0 in | Wt 215.5 lb

## 2015-09-16 DIAGNOSIS — M7581 Other shoulder lesions, right shoulder: Secondary | ICD-10-CM

## 2015-09-16 DIAGNOSIS — M75101 Unspecified rotator cuff tear or rupture of right shoulder, not specified as traumatic: Secondary | ICD-10-CM

## 2015-09-16 DIAGNOSIS — M7541 Impingement syndrome of right shoulder: Secondary | ICD-10-CM

## 2015-09-16 DIAGNOSIS — M7551 Bursitis of right shoulder: Secondary | ICD-10-CM | POA: Diagnosis not present

## 2015-09-16 NOTE — Progress Notes (Signed)
Dr. Karleen HampshireSpencer T. Nazli Penn, MD, CAQ Sports Medicine Primary Care and Sports Medicine [address] [place], [id number] Phone: [phone] Fax: [phone]  09/16/2015  Patient: Luke FoersterMarcus R Abbott, MRN: [id number], DOB: 1976-01-04, 39 y.o.  Primary Physician:  Ruthe Mannanalia Aron, MD   Chief Complaint  Patient presents with  . Follow-up    Right Shoulder   Subjective:   F/u R shoulder:   Doing much better. ROM and str is preserved. Just got back from vacation, and he is really feeling a lot better.   09/11/2015 Last OV with Hannah BeatSpencer Alana Dayton, MD  I actually saw this patient 2 days ago.  Monday, worsened pain, post injection when lidocaine wore off. He received a standard subac injection, but i did use a 21 gauge needle, 2 inch given size of his shoulder.    Has a manual car.  Ice, heat, and he could not get comfortable at all.  He has the rest of the week off from work.   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 male patient noted above presents with R shoulder pain that has been ongoing for 6 mo there is no history of trauma or accident recently The patient denies neck pain or radicular symptoms. Denies dislocation, subluxation, separation of the shoulder. The patient does complain of pain in the overhead plane with significant painful arc of motion.  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   L shoulder surgery. [facility] at [facility].   Lab Results  Component Value Date   HGBA1C 9.6* 02/25/2015   Medications Tried: Tylenol, NSAIDS Ice or Heat: minimally helpful Tried PT: No  Prior shoulder Injury: L Prior surgery: L shoulder arthroscopy 2013, [facility], op note not available Prior fracture: No  The PMH, PSH, Social History, Family History, Medications, and allergies have been reviewed in [facility],  and have been updated if relevant.  Patient Active Problem List   Diagnosis Date Noted  . Pain in joint, shoulder region 07/12/2015  . Vomiting 07/12/2015  . Nausea with vomiting 02/27/2015  . Abdominal pain, epigastric 02/27/2015  . Low back pain 10/01/2014  . Hyperlipidemia 08/21/2014  . Pre-syncope 05/25/2014  . Diabetes mellitus type 2, uncontrolled, without complications [facility](HCC)     Past Medical History  Diagnosis Date  . Diabetes mellitus     Past Surgical History  Procedure Laterality Date  . Irrigation and debridement abscess Right 10/05/2013    Procedure: IRRIGATION AND DEBRIDEMENT RIGHT CHEST WALL ABCESS;  Surgeon: Valarie MerinoMatthew B Martin, MD;  Location: [facility];  Service: General;  Laterality: Right;  . Shoulder arthroscopy with rotator cuff repair    . Ganglion cyst excision  1993    wrist    Social History   Social History  . Marital Status: Legally Separated    Spouse Name: [name]  . Number of Children: 7  . Years of Education: N/A   Occupational History  .  [employer]   Social History Main Topics  . Smoking status: Never Smoker   . Smokeless tobacco: Never Used  . Alcohol Use: Yes     Comment: socially  . Drug Use: No  . Sexual Activity: Yes   Other Topics Concern  . Not on file   Social History Narrative  Family History  Problem Relation Age of Onset  . Hypertension Father   . Kidney disease Mother   . Diabetes Mother     No Known Allergies  Medication list reviewed and updated in full in [facility].  GEN: No fevers, chills. Nontoxic. Primarily MSK c/o today. MSK: Detailed in the HPI GI: tolerating PO intake without difficulty Neuro: No numbness, parasthesias, or tingling associated. Otherwise the pertinent positives of the ROS are noted above.   Objective:   Blood pressure 124/72, pulse 102, temperature 98.5 F (36.9 C), temperature source Oral, height 6' (1.829 m), weight 215 lb 8 oz (97.75 kg).  GEN:  Well-developed,well-nourished,in no acute distress; alert,appropriate and cooperative throughout examination HEENT: Normocephalic and atraumatic without obvious abnormalities. Ears, externally no deformities PULM: Breathing comfortably in no respiratory distress EXT: No clubbing, cyanosis, or edema PSYCH: Normally interactive. Cooperative during the interview. Pleasant. Friendly and conversant. Not anxious or depressed appearing. Normal, full affect.  Shoulder: R Inspection: No muscle wasting or winging Ecchymosis/edema: neg  AC joint, scapula, clavicle: NT Cervical spine: NT, full ROM Spurling's: neg Abduction: full, 5/5 Flexion: full, 5/5 IR, full, lift-off: 5/5 ER at neutral: full, 5/5 AC crossover: neg Neer: neg Hawkins: neg Drop Test: neg Empty Can: neg Supraspinatus insertion: neg Bicipital groove: NT Speed's: neg Yergason's: neg Sulcus sign: neg Scapular dyskinesis: none C5-T1 intact  Neuro: Sensation intact Grip 5/5   Radiology: Dg Shoulder Right  09/04/2015  CLINICAL DATA:  Right shoulder pain for 6 months.  No known injury. EXAM: RIGHT SHOULDER - 2+ VIEW COMPARISON:  None. FINDINGS: Degenerative changes in the right [facility] joint with joint space narrowing and subclavicular spurring. Glenohumeral joint is intact. No acute bony abnormality. Specifically, no fracture, subluxation, or dislocation. Soft tissues are intact. 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 No acute bony abnormality. Electronically Signed   By: Charlett Nose M.D.   On: 09/04/2015 12:42   Assessment and Plan:    Rotator cuff impingement syndrome of right shoulder  Subacromial bursitis, right  Rotator cuff tendinitis, right  He is much improved, cleared to return to work without restrictions  Follow-up: prn  Signed,  Jenne Sellinger T. Tyler Cubit, MD   Patient's Medications  New Prescriptions   No medications on file  Previous Medications   GLIPIZIDE (GLUCOTROL) 5 MG TABLET    Take 1  tablet (5 mg total) by mouth 2 (two) times daily before a meal.   GLUCOSE BLOOD (ONE TOUCH ULTRA TEST) TEST STRIP    1 each by Other route 2 (two) times daily. Use as instructed   INSULIN PEN NEEDLE (BD PEN NEEDLE NANO U/F) 32G X 4 MM MISC    Use once daily for insulin administration   LANTUS SOLOSTAR 100 UNIT/ML SOLOSTAR PEN    Inject 16 Units into the skin at bedtime.   METFORMIN (GLUCOPHAGE) 1000 MG TABLET    Take 1 tablet (1,000 mg total) by mouth 2 (two) times daily.  Modified Medications   No medications on file  Discontinued Medications   No medications on file

## 2015-09-16 NOTE — Progress Notes (Signed)
Pre visit review using our clinic review tool, if applicable. No additional management support is needed unless otherwise documented below in the visit note. 

## 2015-09-23 ENCOUNTER — Telehealth: Payer: Self-pay | Admitting: Family Medicine

## 2015-09-23 DIAGNOSIS — Z7689 Persons encountering health services in other specified circumstances: Secondary | ICD-10-CM

## 2015-09-23 NOTE — Telephone Encounter (Signed)
FMLA paperwork In Dr Copland's IN BOX For review and signature

## 2015-09-23 NOTE — Telephone Encounter (Signed)
Left message asking pt to call office  °

## 2015-09-23 NOTE — Telephone Encounter (Signed)
Pt dropped off fmla paperwork. Placing on [employer] desk. Phone number for pt is [phone] Thank you

## 2015-09-24 NOTE — Telephone Encounter (Signed)
Left message asking Luke Abbott to call the office i need fax number to fax FMLA paperwork back  Also left message asking pt to call office

## 2015-09-24 NOTE — Telephone Encounter (Signed)
Paperwork   Faxed Pt aware  Copy for pt Copy for scan Copy for billing Copy for file

## 2015-12-13 ENCOUNTER — Encounter: Payer: Self-pay | Admitting: Family Medicine

## 2015-12-13 ENCOUNTER — Telehealth: Payer: Self-pay | Admitting: Family Medicine

## 2015-12-13 ENCOUNTER — Ambulatory Visit (INDEPENDENT_AMBULATORY_CARE_PROVIDER_SITE_OTHER): Payer: Managed Care, Other (non HMO) | Admitting: Family Medicine

## 2015-12-13 VITALS — BP 130/90 | HR 93 | Temp 98.4°F | Ht 72.0 in | Wt 217.2 lb

## 2015-12-13 DIAGNOSIS — E1165 Type 2 diabetes mellitus with hyperglycemia: Secondary | ICD-10-CM

## 2015-12-13 DIAGNOSIS — R42 Dizziness and giddiness: Secondary | ICD-10-CM

## 2015-12-13 DIAGNOSIS — IMO0001 Reserved for inherently not codable concepts without codable children: Secondary | ICD-10-CM

## 2015-12-13 DIAGNOSIS — R5383 Other fatigue: Secondary | ICD-10-CM | POA: Diagnosis not present

## 2015-12-13 LAB — COMPREHENSIVE METABOLIC PANEL
ALBUMIN: 4.4 g/dL (ref 3.5–5.2)
ALK PHOS: 105 U/L (ref 39–117)
ALT: 23 U/L (ref 0–53)
AST: 16 U/L (ref 0–37)
BUN: 14 mg/dL (ref 6–23)
CO2: 28 mEq/L (ref 19–32)
Calcium: 10.1 mg/dL (ref 8.4–10.5)
Chloride: 101 mEq/L (ref 96–112)
Creatinine, Ser: 0.94 mg/dL (ref 0.40–1.50)
GFR: 114.17 mL/min (ref >60.00)
Glucose, Bld: 273 mg/dL — ABNORMAL HIGH (ref 70–99)
POTASSIUM: 4.1 meq/L (ref 3.5–5.1)
Sodium: 135 mEq/L (ref 135–145)
TOTAL PROTEIN: 8.3 g/dL (ref 6.0–8.3)
Total Bilirubin: 0.5 mg/dL (ref 0.2–1.2)

## 2015-12-13 LAB — VITAMIN D 25 HYDROXY (VIT D DEFICIENCY, FRACTURES): VITD: 18.55 ng/mL — ABNORMAL LOW (ref 30.00–100.00)

## 2015-12-13 LAB — TSH: TSH: 1.51 u[IU]/mL (ref 0.35–4.50)

## 2015-12-13 LAB — HEMOGLOBIN A1C: HEMOGLOBIN A1C: 11.4 % — AB (ref 4.6–6.5)

## 2015-12-13 LAB — VITAMIN B12: VITAMIN B 12: 741 pg/mL (ref 211–911)

## 2015-12-13 NOTE — Patient Instructions (Signed)
Increase water in take overall. Stop at lab on way out., We will call with the results. Make routine DM follow up with DM since not seeing ENDO any longer.

## 2015-12-13 NOTE — Progress Notes (Signed)
   Subjective:    Patient ID: Luke Abbott, male    DOB: 03-07-1976, 40 y.o.   MRN: [id number]  HPI  40 year old male with history of diabetes presents with new onset dizziness, in last 3 days.  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  When stood up felt woozy. CBG was 210.  He is also tired. No further episodes of wooziness.  Lab Results  Component Value Date   HGBA1C 9.6* 02/25/2015  Trying to eat. He has been talking glucose support with meals in last 3 week instead of metformin and glipizide  He has stopped glipizide. He is using Lantus 14 units daily.  FBS 160-200  No ear pain, no hearing loss. No blood loss.   He feels worse when CBGs in 100s.  Social History /Family History/Past Medical History reviewed and updated if needed. Was seeing ENDO in apst but has not seen in a while.   Review of Systems  Constitutional: Negative for fever and fatigue.  HENT: Negative for ear pain.   Eyes: Negative for pain.  Respiratory: Positive for shortness of breath. Negative for cough and wheezing.        Not new  Cardiovascular: Negative for chest pain, palpitations and leg swelling.  Gastrointestinal: Negative for abdominal pain.  Genitourinary: Negative for dysuria.       Objective:   Physical Exam  Constitutional: Vital signs are normal. He appears well-developed and well-nourished.  HENT:  Head: Normocephalic.  Right Ear: Hearing normal.  Left Ear: Hearing normal.  Nose: Nose normal.  Mouth/Throat: Oropharynx is clear and moist and mucous membranes are normal.  Neck: Trachea normal. Carotid bruit is not present. No thyroid mass and no thyromegaly present.  Cardiovascular: Normal rate, regular rhythm and normal pulses.  Exam reveals no gallop, no distant heart sounds and no friction rub.   No murmur heard. No peripheral edema  Pulmonary/Chest: Effort normal and breath sounds normal. No respiratory distress.  Skin: Skin is warm, dry and intact. No rash noted.   Psychiatric: He has a normal mood and affect. His speech is normal and behavior is normal. Thought content normal.          Assessment & Plan:

## 2015-12-13 NOTE — Assessment & Plan Note (Signed)
Possibly due to dehyrdation from and along with poorly controlled diabetes. Pt states he is sensitive to blood sugar in nml rnage and he has been trying to decrease carbs in diet.  Will eval with labs to rule out other possible causes.

## 2015-12-13 NOTE — Telephone Encounter (Signed)
Pt has appt 12/13/15 at 11:45 with Dr Ermalene Searing.

## 2015-12-13 NOTE — Assessment & Plan Note (Signed)
Very poor control. Discussed in detail with pt that he needs to get CBGs in nl range and keep them there to get his body used to normal values again.  Will check A1C today as he is overdue. Will likely need adjustment in DM regimen and follow up with PCP.

## 2015-12-13 NOTE — Progress Notes (Signed)
Pre visit review using our clinic review tool, if applicable. No additional management support is needed unless otherwise documented below in the visit note. 

## 2015-12-13 NOTE — Telephone Encounter (Signed)
[facility] Day - Client TELEPHONE ADVICE RECORD [facility]  Patient Name: Luke Abbott  DOB: 12/15/1975    Initial Comment Caller states c/o dizziness, lightheaded   Nurse Assessment  Nurse: Wisdom, RN, Susie Date/Time (Eastern Time): 12/13/2015 8:25:23 AM  Confirm and document reason for call. If symptomatic, describe symptoms. You must click the next button to save text entered. ---states he has dizziness and it began yesterday; happens when he stands up - has feeling that he is unable to walk straight - off balance;  Has the patient traveled out of the country within the last 30 days? ---No  Does the patient have any new or worsening symptoms? ---Yes  Will a triage be completed? ---Yes  Related visit to physician within the last 2 weeks? ---No  Does the PT have any chronic conditions? (i.e. diabetes, asthma, etc.) ---Yes  List chronic conditions. ---Diabetes - 201 blood sugar this am;  Is this a behavioral health or substance abuse call? ---No     Guidelines    Guideline Title Affirmed Question Affirmed Notes  Dizziness - Vertigo [1] MODERATE dizziness (e.g., vertigo; feels very unsteady, interferes with normal activities) AND [2] has NOT been evaluated by physician for this    Final Disposition User   See Physician within 24 Hours Wisdom, RN, Susie    Referrals  REFERRED TO PCP OFFICE   Disagree/Comply: Comply

## 2015-12-13 NOTE — Assessment & Plan Note (Signed)
Only one episode now resolved. No suggestion of vertigo. May be due to blood sugar cahnge as blood sugar was high.

## 2015-12-15 LAB — CBC WITH DIFFERENTIAL/PLATELET
Basophils Absolute: 0 10*3/uL (ref 0.0–0.1)
Basophils Relative: 0.3 % (ref 0.0–3.0)
EOS PCT: 1.4 % (ref 0.0–5.0)
Eosinophils Absolute: 0.1 10*3/uL (ref 0.0–0.7)
HCT: 48.6 % (ref 39.0–52.0)
Hemoglobin: 15.1 g/dL (ref 13.0–17.0)
LYMPHS ABS: 3 10*3/uL (ref 0.7–4.0)
Lymphocytes Relative: 40.4 % (ref 12.0–46.0)
MCHC: 31.1 g/dL (ref 30.0–36.0)
MCV: 87.4 fl (ref 78.0–100.0)
Monocytes Absolute: 0.6 10*3/uL (ref 0.1–1.0)
Monocytes Relative: 7.5 % (ref 3.0–12.0)
NEUTROS ABS: 3.7 10*3/uL (ref 1.4–7.7)
NEUTROS PCT: 50.4 % (ref 43.0–77.0)
PLATELETS: 239 10*3/uL (ref 150.0–400.0)
RBC: 5.56 Mil/uL (ref 4.22–5.81)
RDW: 12 % (ref 11.5–15.5)
WBC: 7.3 10*3/uL (ref 4.0–10.5)

## 2016-07-15 ENCOUNTER — Ambulatory Visit (INDEPENDENT_AMBULATORY_CARE_PROVIDER_SITE_OTHER)
Admission: RE | Admit: 2016-07-15 | Discharge: 2016-07-15 | Disposition: A | Discharge status: Home / Self Care | Payer: Managed Care, Other (non HMO) | Source: Ambulatory Visit | Attending: Family Medicine | Admitting: Family Medicine

## 2016-07-15 ENCOUNTER — Ambulatory Visit (INDEPENDENT_AMBULATORY_CARE_PROVIDER_SITE_OTHER): Payer: Managed Care, Other (non HMO) | Admitting: Family Medicine

## 2016-07-15 ENCOUNTER — Encounter: Payer: Self-pay | Admitting: Family Medicine

## 2016-07-15 VITALS — BP 122/84 | HR 94 | Wt 212.1 lb

## 2016-07-15 DIAGNOSIS — S99922A Unspecified injury of left foot, initial encounter: Secondary | ICD-10-CM

## 2016-07-15 DIAGNOSIS — E1165 Type 2 diabetes mellitus with hyperglycemia: Secondary | ICD-10-CM

## 2016-07-15 DIAGNOSIS — IMO0001 Reserved for inherently not codable concepts without codable children: Secondary | ICD-10-CM
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
## 2016-07-15 NOTE — Patient Instructions (Signed)
You can take ibuprofen 2 tablets every 8 to 12 hours for pain/swelling Elevate when sitting Wear brace during the day, remove several times a day and do range of motion exercises. Wear brace daily until you are able to stand on your left foot   Acute Ankle Sprain With Phase I Rehab An acute ankle sprain is a partial or complete tear in one or more of the ligaments of the ankle due to traumatic injury. The severity of the injury depends on both the number of ligaments sprained and the grade of sprain. There are 3 grades of sprains.   A grade 1 sprain is a mild sprain. There is a slight pull without obvious tearing. There is no loss of strength, and the muscle and ligament are the correct length.  A grade 2 sprain is a moderate sprain. There is tearing of fibers within the substance of the ligament where it connects two bones or two cartilages. The length of the ligament is increased, and there is usually decreased strength.  A grade 3 sprain is a complete rupture of the ligament and is uncommon. In addition to the grade of sprain, there are three types of ankle sprains.  Lateral ankle sprains: This is a sprain of one or more of the three ligaments on the outer side (lateral) of the ankle. These are the most common sprains. Medial ankle sprains: There is one large triangular ligament of the inner side (medial) of the ankle that is susceptible to injury. Medial ankle sprains are less common. Syndesmosis, "high ankle," sprains: The syndesmosis is the ligament that connects the two bones of the lower leg. Syndesmosis sprains usually only occur with very severe ankle sprains. SYMPTOMS  Pain, tenderness, and swelling in the ankle, starting at the side of injury that may progress to the whole ankle and foot with time.  "Pop" or tearing sensation at the time of injury.  Bruising that may spread to the heel.  Impaired ability to walk soon after injury. CAUSES   Acute ankle sprains are caused by  trauma placed on the ankle that temporarily forces or pries the anklebone (talus) out of its normal socket.  Stretching or tearing of the ligaments that normally hold the joint in place (usually due to a twisting injury). RISK INCREASES WITH:  Previous ankle sprain.  Sports in which the foot may land awkwardly (i.e., basketball, volleyball, or soccer) or walking or running on uneven or rough surfaces.  Shoes with inadequate support to prevent sideways motion when stress occurs.  Poor strength and flexibility.  Poor balance skills.  Contact sports. PREVENTION   Warm up and stretch properly before activity.  Maintain physical fitness:  Ankle and leg flexibility, muscle strength, and endurance.  Cardiovascular fitness.  Balance training activities.  Use proper technique and have a coach correct improper technique.  Taping, protective strapping, bracing, or high-top tennis shoes may help prevent injury. Initially, tape is best; however, it loses most of its support function within 10 to 15 minutes.  Wear proper-fitted protective shoes (High-top shoes with taping or bracing is more effective than either alone).  Provide the ankle with support during sports and practice activities for 12 months following injury. PROGNOSIS   If treated properly, ankle sprains can be expected to recover completely; however, the length of recovery depends on the degree of injury.  A grade 1 sprain usually heals enough in 5 to 7 days to allow modified activity and requires an average of 6 weeks to heal  completely.  A grade 2 sprain requires 6 to 10 weeks to heal completely.  A grade 3 sprain requires 12 to 16 weeks to heal.  A syndesmosis sprain often takes more than 3 months to heal. RELATED COMPLICATIONS   Frequent recurrence of symptoms may result in a chronic problem. Appropriately addressing the problem the first time decreases the frequency of recurrence and optimizes healing time.  Severity of the initial sprain does not predict the likelihood of later instability.  Injury to other structures (bone, cartilage, or tendon).  A chronically unstable or arthritic ankle joint is a possibility with repeated sprains. TREATMENT Treatment initially involves the use of ice, medication, and compression bandages to help reduce pain and inflammation. Ankle sprains are usually immobilized in a walking cast or boot to allow for healing. Crutches may be recommended to reduce pressure on the injury. After immobilization, strengthening and stretching exercises may be necessary to regain strength and a full range of motion. Surgery is rarely needed to treat ankle sprains. MEDICATION   Nonsteroidal anti-inflammatory medications, such as aspirin and ibuprofen (do not take for the first 3 days after injury or within 7 days before surgery), or other minor pain relievers, such as acetaminophen, are often recommended. Take these as directed by your caregiver. Contact your caregiver immediately if any bleeding, stomach upset, or signs of an allergic reaction occur from these medications.  Ointments applied to the skin may be helpful.  Pain relievers may be prescribed as necessary by your caregiver. Do not take prescription pain medication for longer than 4 to 7 days. Use only as directed and only as much as you need. HEAT AND COLD  Cold treatment (icing) is used to relieve pain and reduce inflammation for acute and chronic cases. Cold should be applied for 10 to 15 minutes every 2 to 3 hours for inflammation and pain and immediately after any activity that aggravates your symptoms. Use ice packs or an ice massage.  Heat treatment may be used before performing stretching and strengthening activities prescribed by your caregiver. Use a heat pack or a warm soak. SEEK IMMEDIATE MEDICAL CARE IF:   Pain, swelling, or bruising worsens despite treatment.  You experience pain, numbness, discoloration, or  coldness in the foot or toes.  New, unexplained symptoms develop (drugs used in treatment may produce side effects.) EXERCISES  PHASE I EXERCISES RANGE OF MOTION (ROM) AND STRETCHING EXERCISES - Ankle Sprain, Acute Phase I, Weeks 1 to 2 These exercises may help you when beginning to restore flexibility in your ankle. You will likely work on these exercises for the 1 to 2 weeks after your injury. Once your physician, physical therapist, or athletic trainer sees adequate progress, he or she will advance your exercises. While completing these exercises, remember:   Restoring tissue flexibility helps normal motion to return to the joints. This allows healthier, less painful movement and activity.  An effective stretch should be held for at least 30 seconds.  A stretch should never be painful. You should only feel a gentle lengthening or release in the stretched tissue. RANGE OF MOTION - Dorsi/Plantar Flexion  While sitting with your right / left knee straight, draw the top of your foot upwards by flexing your ankle. Then reverse the motion, pointing your toes downward.  Hold each position for __________ seconds.  After completing your first set of exercises, repeat this exercise with your knee bent. Repeat __________ times. Complete this exercise __________ times per day.  RANGE OF MOTION -  Ankle Alphabet  Imagine your right / left big toe is a pen.  Keeping your hip and knee still, write out the entire alphabet with your "pen." Make the letters as large as you can without increasing any discomfort. Repeat __________ times. Complete this exercise __________ times per day.  STRENGTHENING EXERCISES - Ankle Sprain, Acute -Phase I, Weeks 1 to 2 These exercises may help you when beginning to restore strength in your ankle. You will likely work on these exercises for 1 to 2 weeks after your injury. Once your physician, physical therapist, or athletic trainer sees adequate progress, he or she will  advance your exercises. While completing these exercises, remember:   Muscles can gain both the endurance and the strength needed for everyday activities through controlled exercises.  Complete these exercises as instructed by your physician, physical therapist, or athletic trainer. Progress the resistance and repetitions only as guided.  You may experience muscle soreness or fatigue, but the pain or discomfort you are trying to eliminate should never worsen during these exercises. If this pain does worsen, stop and make certain you are following the directions exactly. If the pain is still present after adjustments, discontinue the exercise until you can discuss the trouble with your clinician. STRENGTH - Dorsiflexors  Secure a rubber exercise band/tubing to a fixed object (i.e., table, pole) and loop the other end around your right / left foot.  Sit on the floor facing the fixed object. The band/tubing should be slightly tense when your foot is relaxed.  Slowly draw your foot back toward you using your ankle and toes.  Hold this position for __________ seconds. Slowly release the tension in the band and return your foot to the starting position. Repeat __________ times. Complete this exercise __________ times per day.  STRENGTH - Plantar-flexors   Sit with your right / left leg extended. Holding onto both ends of a rubber exercise band/tubing, loop it around the ball of your foot. Keep a slight tension in the band.  Slowly push your toes away from you, pointing them downward.  Hold this position for __________ seconds. Return slowly, controlling the tension in the band/tubing. Repeat __________ times. Complete this exercise __________ times per day.  STRENGTH - Ankle Eversion  Secure one end of a rubber exercise band/tubing to a fixed object (table, pole). Loop the other end around your foot just before your toes.  Place your fists between your knees. This will focus your strengthening  at your ankle.  Drawing the band/tubing across your opposite foot, slowly, pull your little toe out and up. Make sure the band/tubing is positioned to resist the entire motion.  Hold this position for __________ seconds. Have your muscles resist the band/tubing as it slowly pulls your foot back to the starting position.  Repeat __________ times. Complete this exercise __________ times per day.  STRENGTH - Ankle Inversion  Secure one end of a rubber exercise band/tubing to a fixed object (table, pole). Loop the other end around your foot just before your toes.  Place your fists between your knees. This will focus your strengthening at your ankle.  Slowly, pull your big toe up and in, making sure the band/tubing is positioned to resist the entire motion.  Hold this position for __________ seconds.  Have your muscles resist the band/tubing as it slowly pulls your foot back to the starting position. Repeat __________ times. Complete this exercises __________ times per day.  STRENGTH - Towel Curls  Sit in a chair  positioned on a non-carpeted surface.  Place your right / left foot on a towel, keeping your heel on the floor.  Pull the towel toward your heel by only curling your toes. Keep your heel on the floor.  If instructed by your physician, physical therapist, or athletic trainer, add weight to the end of the towel. Repeat __________ times. Complete this exercise __________ times per day.   This information is not intended to replace advice given to you by your health care provider. Make sure you discuss any questions you have with your health care provider.   Document Released: 05/20/2005 Document Revised: 11/09/2014 Document Reviewed: 01/31/2009 Elsevier Interactive Patient Education [employer]

## 2016-07-15 NOTE — Progress Notes (Signed)
Pre visit review using our clinic review tool, if applicable. No additional management support is needed unless otherwise documented below in the visit note. 

## 2016-07-16 LAB — HEMOGLOBIN A1C: Hgb A1c MFr Bld: 12 % — ABNORMAL HIGH (ref 4.6–6.5)

## 2016-07-21 ENCOUNTER — Other Ambulatory Visit: Payer: Self-pay | Admitting: Family Medicine

## 2016-07-21 DIAGNOSIS — E1165 Type 2 diabetes mellitus with hyperglycemia: Secondary | ICD-10-CM

## 2016-08-17 ENCOUNTER — Encounter: Payer: Self-pay | Admitting: Family Medicine

## 2016-10-05 ENCOUNTER — Telehealth: Payer: Self-pay | Admitting: *Deleted

## 2016-10-05 NOTE — Telephone Encounter (Signed)
Patient's wife (not on DPR) left a voicemail stating that patient still is having chronic shoulder pain and feels that he needs to have a MRI done before the end of the year. Patient's wife stated that he has had injections done by Dr. Patsy Lageropland which have not helped. You can call the patient back.

## 2016-10-06 NOTE — Telephone Encounter (Signed)
Was this message intended for Dr. Patsy Lageropland?  Will route to him.

## 2016-10-07 NOTE — Telephone Encounter (Signed)
Berna SpareMarcus notified as instructed by telephone.  Appointment scheduled with Dr. Patsy Lageropland 10/12/2016 at 12:15 pm to re-evaluate right shoulder.

## 2016-10-07 NOTE — Telephone Encounter (Signed)
I have not seen the patient in a year - impossible to order an MRI.  I am happy to recheck him and reassess for additional imaging.

## 2016-10-12 ENCOUNTER — Ambulatory Visit (INDEPENDENT_AMBULATORY_CARE_PROVIDER_SITE_OTHER): Payer: Managed Care, Other (non HMO) | Admitting: Family Medicine

## 2016-10-12 ENCOUNTER — Encounter: Payer: Self-pay | Admitting: Family Medicine

## 2016-10-12 VITALS — BP 124/68 | HR 95 | Temp 98.6°F | Ht 71.0 in | Wt 213.5 lb

## 2016-10-12 DIAGNOSIS — R29898 Other symptoms and signs involving the musculoskeletal system: Secondary | ICD-10-CM | POA: Diagnosis not present

## 2016-10-12 DIAGNOSIS — M25511 Pain in right shoulder: Secondary | ICD-10-CM | POA: Diagnosis not present

## 2016-10-12 NOTE — Progress Notes (Signed)
Dr. Karleen Hampshire T. Sophiya Morello, MD, CAQ Sports Medicine Primary [facility] and Sports [facility] [address] [place], [address] Phone: [phone] Fax: [phone]  10/12/2016  Patient: Luke Abbott, MRN: [id number], DOB: [birth year], 40 y.o.  Primary Physician:  Luke Mannan, MD   Chief Complaint  Patient presents with  . Shoulder Pain    Right   Subjective:   F/u R shoulder: Interval history, the patient has done relatively poorly over the last year. I have not seen him in more than 12 months time. At that time, the patient did have normal radiographs.  Particularly this is worsened since May 2017. 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   He has significant pain with abduction, external range of motion as well as functional use of his bicep. He also has some stiffness in pain with terminal internal range of motion, external range of motion, as well as abduction, but they all approach full motion. His A1c is 12.  He denies any new trauma or accident. We did do a subacromial injection times 1 on the right shoulder last year.  Left shoulder surgery, at [facility]   09/16/2015 Last OV with Luke Beat, MD  Doing much better. ROM and str is preserved. Just got back from vacation, and he is really feeling a lot better.   09/11/2015 Last OV with Luke Beat, MD  I actually saw this patient 2 days ago.  Monday, worsened pain, post injection when lidocaine wore off. He received a standard subac injection, but i did use a 21 gauge needle, 2 inch given size of his shoulder.    Has a manual car.  Ice, heat, and he could not get comfortable at all.  He has the rest of the week off from work.   Drives a Presenter, broadcasting for work.  "Needs FMLA paperwork filled out."  09/09/2015 Last OV with Luke Beat, MD  This 40 y.o. male patient noted above presents with R shoulder pain that has been ongoing for 6 mo there is no history of trauma or accident recently The patient denies neck  pain or radicular symptoms. Denies dislocation, subluxation, separation of the shoulder. The patient does complain of pain in the overhead plane with significant painful arc of motion.  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   L shoulder surgery. 2013 at [facility].   Lab Results  Component Value Date   HGBA1C 9.6* 02/25/2015   Medications Tried: Tylenol, NSAIDS Ice or Heat: minimally helpful Tried PT: No  Prior shoulder Injury: L Prior surgery: L shoulder arthroscopy 2013, [facility], op note not available Prior fracture: No  The PMH, PSH, Social History, Family History, Medications, and allergies have been reviewed in [facility], and have been updated if relevant.  Patient Active Problem List   Diagnosis Date Noted  . Fatigue 12/13/2015  . Lightheadedness 12/13/2015  . Pain in joint, shoulder region 07/12/2015  . Vomiting 07/12/2015  . Nausea with vomiting 02/27/2015  . Abdominal pain, epigastric 02/27/2015  . Low back pain 10/01/2014  . Hyperlipidemia 08/21/2014  . Pre-syncope 05/25/2014  . Diabetes mellitus type 2, uncontrolled, without complications (HCC)     Past Medical History:  Diagnosis Date  . Diabetes mellitus     Past Surgical History:  Procedure Laterality Date  . GANGLION CYST EXCISION  1993   wrist  . IRRIGATION AND DEBRIDEMENT  ABSCESS Right 10/05/2013   Procedure: IRRIGATION AND DEBRIDEMENT RIGHT CHEST WALL ABCESS;  Surgeon: Luke MerinoMatthew B Martin, MD;  Location: [facility];  Service: General;  Laterality: Right;  . SHOULDER ARTHROSCOPY WITH ROTATOR CUFF REPAIR      Social History   Social History  . Marital status: Legally Separated    Spouse name: Luke Abbott  . Number of children: 7  . Years of education: N/A   Occupational History  .  [employer]   Social History Main Topics  . Smoking status: Never Smoker  . Smokeless tobacco: Never Used  . Alcohol  use Yes     Comment: socially  . Drug use: No  . Sexual activity: Yes   Other Topics Concern  . Not on file   Social History Narrative  . No narrative on file    Family History  Problem Relation Age of Onset  . Hypertension Father   . Kidney disease Mother   . Diabetes Mother     No Known Allergies  Medication list reviewed and updated in full in [employer].  GEN: No fevers, chills. Nontoxic. Primarily MSK c/o today. MSK: Detailed in the HPI GI: tolerating PO intake without difficulty Neuro: No numbness, parasthesias, or tingling associated. Otherwise the pertinent positives of the ROS are noted above.   Objective:   Blood pressure 124/68, pulse 95, temperature 98.6 F (37 C), temperature source Oral, height 5\' 11"  (1.803 m), weight 213 lb 8 oz (96.8 kg).  GEN: Well-developed,well-nourished,in no acute distress; alert,appropriate and cooperative throughout examination HEENT: Normocephalic and atraumatic without obvious abnormalities. Ears, externally no deformities PULM: Breathing comfortably in no respiratory distress EXT: No clubbing, cyanosis, or edema PSYCH: Normally interactive. Cooperative during the interview. Pleasant. Friendly and conversant. Not anxious or depressed appearing. Normal, full affect.  Shoulder: R Inspection: No muscle wasting or winging Ecchymosis/edema: neg  AC joint, scapula, clavicle: NT Cervical spine: NT, full ROM Spurling's: neg Abduction: full, 4/5 Flexion: full, 5/5 IR, lacks 30 deg, lift-off: 5/5 ER at neutral: lacks 15 deg, 4+/5 AC crossover: + Neer: + Hawkins: + Drop Test: + Empty Can: + Supraspinatus insertion: neg Bicipital groove: TTP Speed's: + Yergason's: + Sulcus sign: neg Scapular dyskinesis: none C5-T1 intact  Neuro: Sensation intact Grip 5/5   Radiology: Dg Shoulder Right  09/04/2015  CLINICAL DATA:  Right shoulder pain for 6 months.  No known injury. EXAM: RIGHT SHOULDER - 2+ VIEW COMPARISON:  None.  FINDINGS: Degenerative changes in the right [facility] joint with joint space narrowing and subclavicular spurring. Glenohumeral joint is intact. No acute bony abnormality. Specifically, no fracture, subluxation, or dislocation. Soft tissues are intact. 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 No acute bony abnormality. Electronically Signed   By: Charlett NoseKevin  Dover M.D.   On: 09/04/2015 12:42   Assessment and Plan:    Right shoulder pain, unspecified chronicity - Plan: MR Shoulder Right Wo Contrast  Weakness of right upper extremity - Plan: MR Shoulder Right Wo Contrast  The patient has done quite poorly over greater than one year's time of conservative care with somewhat lost to follow-up. He now returns and is doing quite poorly. Obtain an MRI of the right shoulder without contrast, open MRI, to evaluate for potential rotator cuff tear, supraspinatus as well as infraspinatus, biceps tendon injury, partial versus full thickness tears, or tendinopathy's.  Further plan of care will be determined by advanced imaging.  Follow-up: No Follow-up on file.  Meds ordered this encounter  Medications  .  metFORMIN (GLUCOPHAGE-XR) 500 MG 24 hr tablet    Sig: Take by mouth.  . Insulin Glargine-Lixisenatide 100-33 UNT-MCG/ML SOPN    Sig: Inject into the skin.   Medications Discontinued During This Encounter  Medication Reason  . glipiZIDE (GLUCOTROL) 5 MG tablet Change in therapy  . LANTUS SOLOSTAR 100 UNIT/ML Solostar Pen Change in therapy  . metFORMIN (GLUCOPHAGE) 1000 MG tablet Change in therapy   Orders Placed This Encounter  Procedures  . MR Shoulder Right Wo Contrast    Signed,  Shona Pardo T. Inaaya Vellucci, MD     Medication List       Accurate as of 10/12/16  1:50 PM. Always use your most recent med list.          glucose blood test strip Commonly known as:  ONE TOUCH ULTRA TEST 1 each by Other route 2 (two) times daily. Use as instructed   Insulin Glargine-Lixisenatide 100-33  UNT-MCG/ML Sopn Inject into the skin.   Insulin Pen Needle 32G X 4 MM Misc Commonly known as:  BD PEN NEEDLE NANO U/F Use once daily for insulin administration   metFORMIN 500 MG 24 hr tablet Commonly known as:  GLUCOPHAGE-XR Take by mouth.   MISC NATURAL PRODUCTS PO Take 3 tablets by mouth daily. Glucose Support

## 2016-10-12 NOTE — Patient Instructions (Signed)

## 2016-10-20 ENCOUNTER — Ambulatory Visit
Admission: RE | Admit: 2016-10-20 | Discharge: 2016-10-20 | Disposition: A | Discharge status: Home / Self Care | Payer: Managed Care, Other (non HMO) | Source: Ambulatory Visit | Attending: Family Medicine | Admitting: Family Medicine

## 2016-10-20 DIAGNOSIS — M25511 Pain in right shoulder: Secondary | ICD-10-CM

## 2016-10-20 DIAGNOSIS — R29898 Other symptoms and signs involving the musculoskeletal system: Secondary | ICD-10-CM

## 2016-10-23 ENCOUNTER — Encounter: Payer: Self-pay | Admitting: Family Medicine

## 2016-11-03 ENCOUNTER — Other Ambulatory Visit: Payer: Self-pay | Admitting: Family Medicine

## 2016-11-03 DIAGNOSIS — M75111 Incomplete rotator cuff tear or rupture of right shoulder, not specified as traumatic: Secondary | ICD-10-CM

## 2016-11-03 DIAGNOSIS — M19019 Primary osteoarthritis, unspecified shoulder: Secondary | ICD-10-CM

## 2016-11-03 DIAGNOSIS — M7581 Other shoulder lesions, right shoulder: Secondary | ICD-10-CM

## 2016-11-04 ENCOUNTER — Ambulatory Visit: Payer: Managed Care, Other (non HMO) | Admitting: Family Medicine

## 2017-04-21 ENCOUNTER — Ambulatory Visit (INDEPENDENT_AMBULATORY_CARE_PROVIDER_SITE_OTHER)
Admission: RE | Admit: 2017-04-21 | Discharge: 2017-04-21 | Disposition: A | Discharge status: Home / Self Care | Payer: Managed Care, Other (non HMO) | Source: Ambulatory Visit | Attending: Family Medicine | Admitting: Family Medicine

## 2017-04-21 ENCOUNTER — Encounter: Payer: Self-pay | Admitting: Family Medicine

## 2017-04-21 ENCOUNTER — Ambulatory Visit (INDEPENDENT_AMBULATORY_CARE_PROVIDER_SITE_OTHER): Payer: Managed Care, Other (non HMO) | Admitting: Family Medicine

## 2017-04-21 VITALS — BP 102/80 | HR 81 | Wt 223.0 lb

## 2017-04-21 DIAGNOSIS — R0781 Pleurodynia: Secondary | ICD-10-CM

## 2017-04-21 DIAGNOSIS — R079 Chest pain, unspecified: Secondary | ICD-10-CM | POA: Insufficient documentation

## 2017-04-21 DIAGNOSIS — R109 Unspecified abdominal pain: Secondary | ICD-10-CM

## 2017-04-21 DIAGNOSIS — K5900 Constipation, unspecified: Secondary | ICD-10-CM | POA: Diagnosis not present

## 2017-04-21 LAB — COMPREHENSIVE METABOLIC PANEL
ALK PHOS: 91 U/L (ref 39–117)
ALT: 15 U/L (ref 0–53)
AST: 15 U/L (ref 0–37)
Albumin: 3.9 g/dL (ref 3.5–5.2)
BILIRUBIN TOTAL: 0.4 mg/dL (ref 0.2–1.2)
BUN: 11 mg/dL (ref 6–23)
CO2: 27 meq/L (ref 19–32)
CREATININE: 0.85 mg/dL (ref 0.40–1.50)
Calcium: 9.1 mg/dL (ref 8.4–10.5)
Chloride: 105 mEq/L (ref 96–112)
GFR: 127.37 mL/min (ref >60.00)
GLUCOSE: 120 mg/dL — AB (ref 70–99)
Potassium: 3.7 mEq/L (ref 3.5–5.1)
Sodium: 139 mEq/L (ref 135–145)
TOTAL PROTEIN: 7.1 g/dL (ref 6.0–8.3)

## 2017-04-21 LAB — CBC WITH DIFFERENTIAL/PLATELET
BASOS ABS: 0 10*3/uL (ref 0.0–0.1)
Basophils Relative: 0.5 % (ref 0.0–3.0)
EOS ABS: 0.1 10*3/uL (ref 0.0–0.7)
Eosinophils Relative: 1.9 % (ref 0.0–5.0)
HCT: 39.9 % (ref 39.0–52.0)
Hemoglobin: 13.3 g/dL (ref 13.0–17.0)
LYMPHS ABS: 2 10*3/uL (ref 0.7–4.0)
Lymphocytes Relative: 31.8 % (ref 12.0–46.0)
MCHC: 33.4 g/dL (ref 30.0–36.0)
MCV: 82 fl (ref 78.0–100.0)
Monocytes Absolute: 0.6 10*3/uL (ref 0.1–1.0)
Monocytes Relative: 8.6 % (ref 3.0–12.0)
NEUTROS ABS: 3.7 10*3/uL (ref 1.4–7.7)
NEUTROS PCT: 57.2 % (ref 43.0–77.0)
PLATELETS: 230 10*3/uL (ref 150.0–400.0)
RBC: 4.86 Mil/uL (ref 4.22–5.81)
RDW: 12.5 % (ref 11.5–15.5)
WBC: 6.4 10*3/uL (ref 4.0–10.5)

## 2017-04-21 LAB — LIPASE: Lipase: 29 U/L (ref 11.0–59.0)

## 2017-04-21 NOTE — Assessment & Plan Note (Signed)
?   Due to constipation. Had a BM this morning and symptoms improving. Check labs and KUB today. The patient indicates understanding of these issues and agrees with the plan.

## 2017-04-21 NOTE — Progress Notes (Signed)
Subjective:   Patient ID: Luke Abbott, male    DOB: 1976-03-21, 41 y.o.   MRN: [id number]  Luke FoersterMarcus R Mcinerney is a pleasant 41 y.o. year old male who presents to clinic today with Chest Pain (left side, since Monday ); Constipation; and Bloated  on 04/21/2017  HPI:  I have not seen pt in over two years.  Has several complaints today.  1. Left sided chest pain- painful with movement but not necessarily exertion.  Upon questions, is really located in the left lower abdominal quadrant. On and off for two days.  2.  Constipation/bloating-  Has been associated with this left lower quadrant pain.  No blood in stool.  No nausea or vomiting.  No fevers.  Current Outpatient Prescriptions on File Prior to Visit  Medication Sig Dispense Refill  . glucose blood (ONE TOUCH ULTRA TEST) test strip 1 each by Other route 2 (two) times daily. Use as instructed 50 each 12  . Insulin Glargine-Lixisenatide 100-33 UNT-MCG/ML SOPN Inject into the skin.    . Insulin Pen Needle (BD PEN NEEDLE NANO U/F) 32G X 4 MM MISC Use once daily for insulin administration 30 each 6  . metFORMIN (GLUCOPHAGE-XR) 500 MG 24 hr tablet Take by mouth.    Marland Kitchen. MISC NATURAL PRODUCTS PO Take 3 tablets by mouth daily. Glucose Support     No current facility-administered medications on file prior to visit.     No Known Allergies  Past Medical History:  Diagnosis Date  . Diabetes mellitus     Past Surgical History:  Procedure Laterality Date  . GANGLION CYST EXCISION  1993   wrist  . IRRIGATION AND DEBRIDEMENT ABSCESS Right 10/05/2013   Procedure: IRRIGATION AND DEBRIDEMENT RIGHT CHEST WALL ABCESS;  Surgeon: Valarie MerinoMatthew B Martin, MD;  Location: [facility];  Service: General;  Laterality: Right;  . SHOULDER ARTHROSCOPY WITH ROTATOR CUFF REPAIR      Family History  Problem Relation Age of Onset  . Hypertension Father   . Kidney disease Mother   . Diabetes Mother     Social History   Social History  . Marital status: Legally  Separated    Spouse name: [name]  . Number of children: 7  . Years of education: N/A   Occupational History  .  [employer]   Social History Main Topics  . Smoking status: Never Smoker  . Smokeless tobacco: Never Used  . Alcohol use Yes     Comment: socially  . Drug use: No  . Sexual activity: Yes   Other Topics Concern  . Not on file   Social History Narrative  . No narrative on file   The PMH, PSH, Social History, Family History, Medications, and allergies have been reviewed in [facility], and have been updated if relevant.   Review of Systems  Respiratory: Negative.   Cardiovascular: Positive for chest pain. Negative for palpitations and leg swelling.  Gastrointestinal: Positive for abdominal distention and constipation. Negative for abdominal pain, anal bleeding, blood in stool, diarrhea, nausea, rectal pain and vomiting.  All other systems reviewed and are negative.      Objective:    BP 102/80   Pulse 81   Wt 223 lb (101.2 kg)   SpO2 98%   BMI 31.10 kg/m    Physical Exam  Constitutional: He is oriented to person, place, and time. He appears well-developed and well-nourished. No distress.  HENT:  Head: Normocephalic and atraumatic.  Eyes: Conjunctivae are normal.  Cardiovascular:  Normal rate.   Pulmonary/Chest: Effort normal.  Abdominal: Soft. Bowel sounds are normal. He exhibits no distension and no mass. There is no tenderness. There is no rebound and no guarding.  Musculoskeletal: Normal range of motion.  Neurological: He is alert and oriented to person, place, and time. No cranial nerve deficit.  Skin: Skin is warm and dry. He is not diaphoretic.  Psychiatric: He has a normal mood and affect. His behavior is normal. Judgment and thought content normal.  Nursing note and vitals reviewed.         Assessment & Plan:   Rib pain - Plan: EKG 12-Lead  Left sided chest pain  Constipation, unspecified constipation type No Follow-up on file.

## 2017-04-21 NOTE — Assessment & Plan Note (Signed)
EKG reassuring- unchanged from prior. And upon questioning, pain is localized to left lower abdomen, not chest.

## 2017-12-12 IMAGING — DX DG FOOT COMPLETE 3+V*L*
3 series · 3 of 3 positions shown · non-contrast
Comparison: None.

CLINICAL DATA: Left foot injury and swelling. Trauma 2 days ago.
Initial encounter.

EXAM:
LEFT FOOT - COMPLETE 3+ VIEW

[foot ap]
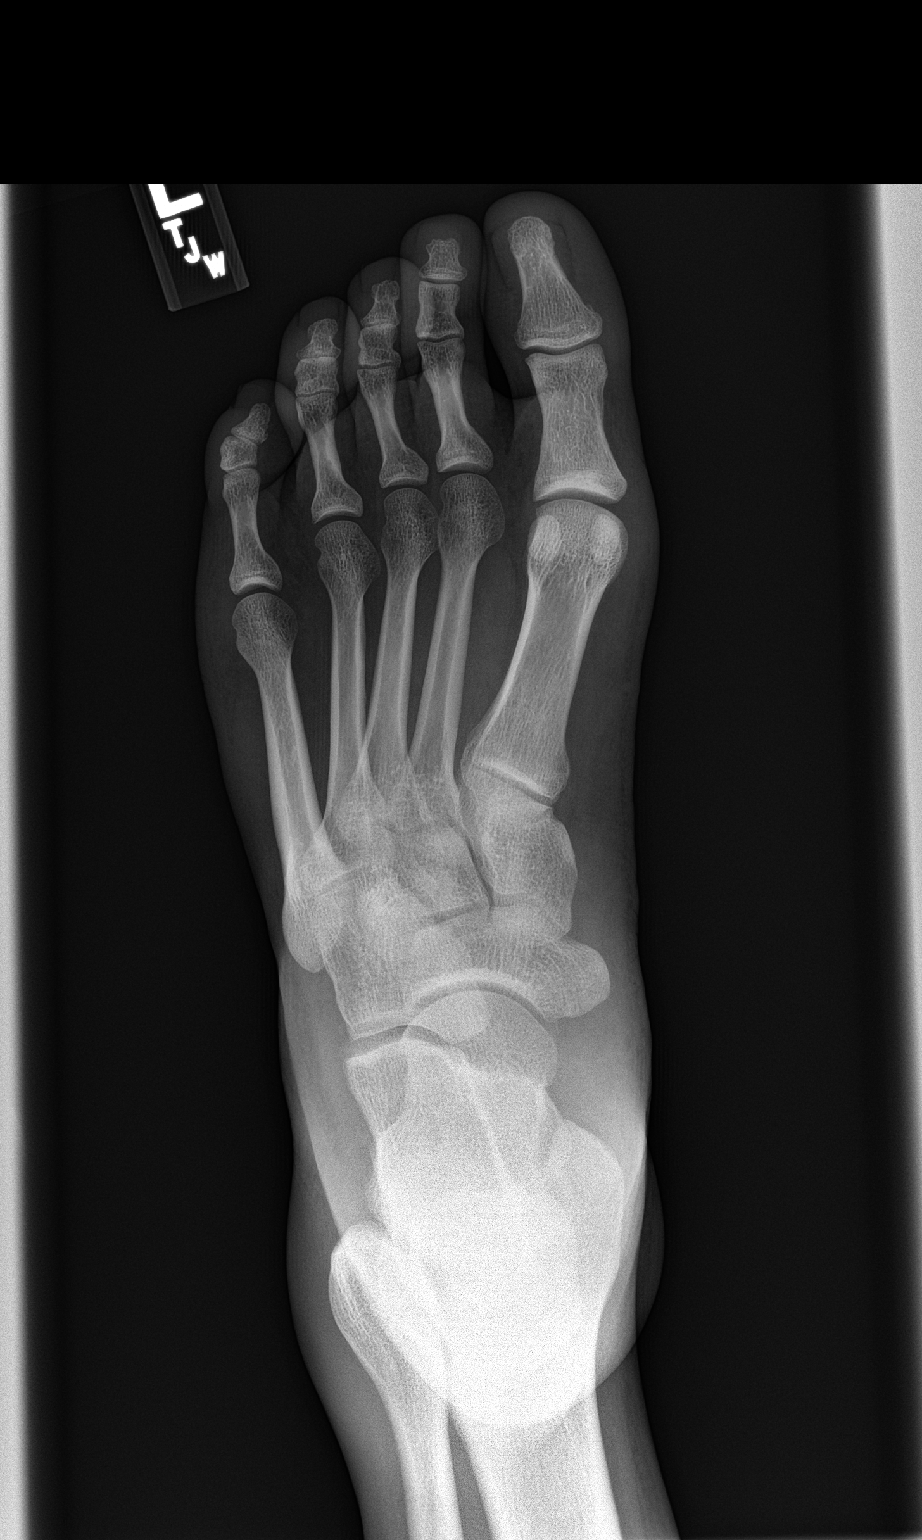

[foot obl]
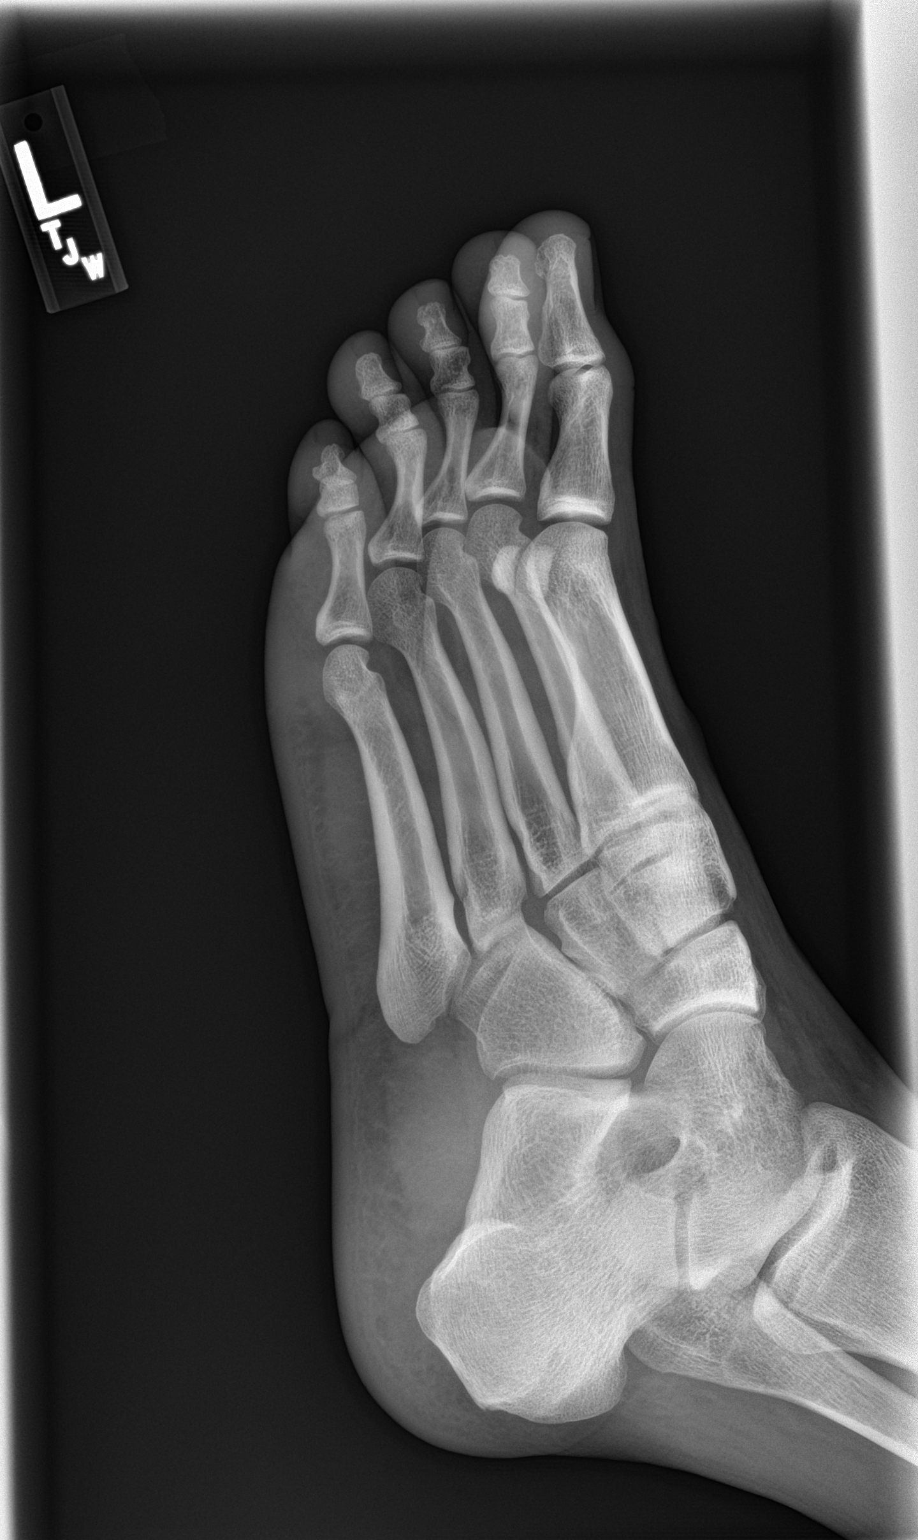

[foot lat]
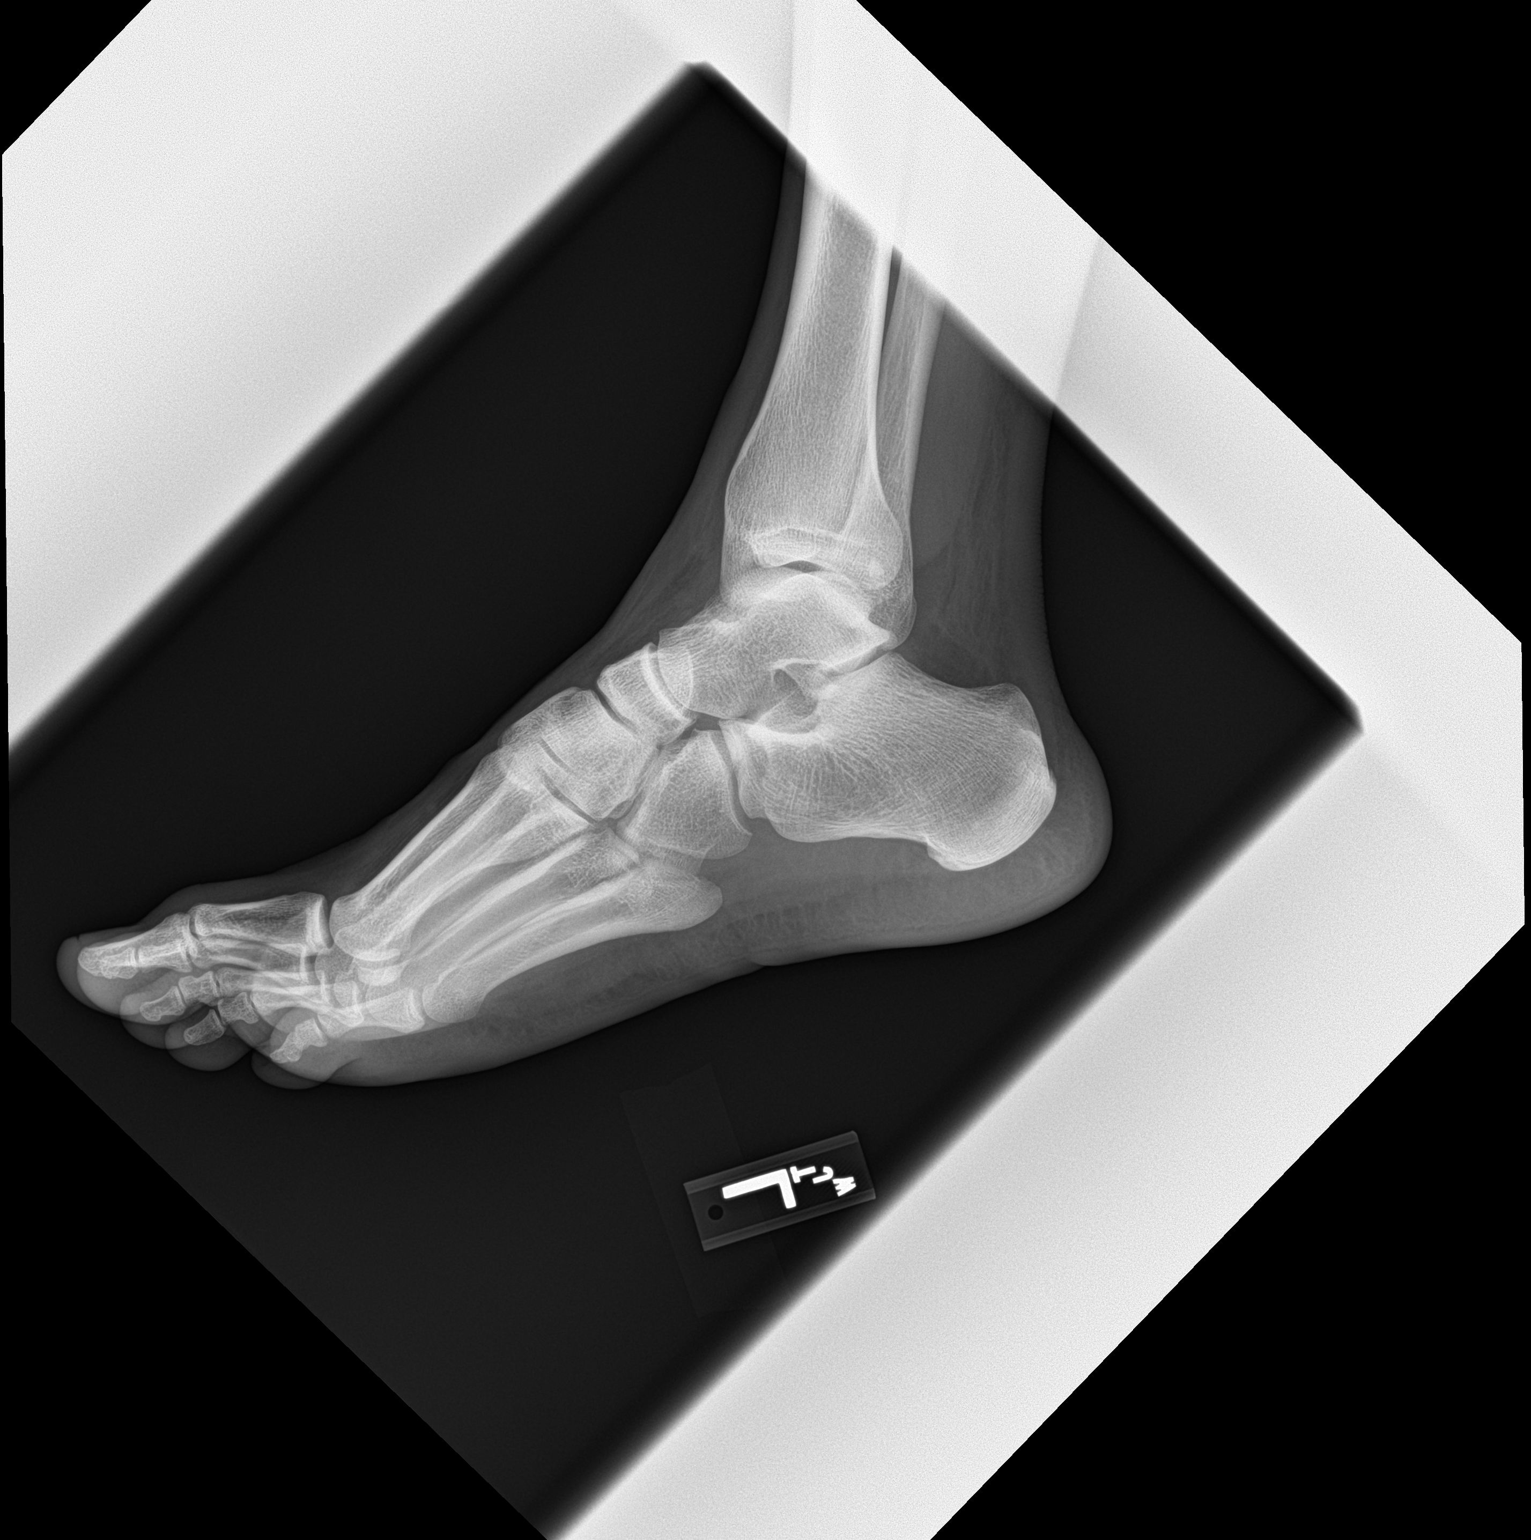

[3 of 3 positions shown; findings below may reference images not displayed]

FINDINGS: There is no evidence of fracture or dislocation. There is no
evidence of arthropathy or other focal bone abnormality. Soft
tissues are unremarkable.
IMPRESSION: Negative.

## 2018-03-07 ENCOUNTER — Encounter: Payer: Self-pay | Admitting: Family Medicine

## 2018-03-07 ENCOUNTER — Ambulatory Visit: Payer: Managed Care, Other (non HMO) | Admitting: Family Medicine

## 2018-03-07 VITALS — BP 134/88 | HR 101 | Temp 98.8°F | Ht 71.0 in | Wt 231.4 lb

## 2018-03-07 DIAGNOSIS — E1165 Type 2 diabetes mellitus with hyperglycemia: Secondary | ICD-10-CM | POA: Diagnosis not present

## 2018-03-07 DIAGNOSIS — IMO0001 Reserved for inherently not codable concepts without codable children: Secondary | ICD-10-CM

## 2018-03-07 DIAGNOSIS — R05 Cough: Secondary | ICD-10-CM | POA: Diagnosis not present

## 2018-03-07 DIAGNOSIS — R058 Other specified cough: Secondary | ICD-10-CM

## 2018-03-07 LAB — POCT UA - MICROALBUMIN
Creatinine, POC: 10 mg/dL
Microalbumin Ur, POC: 30 mg/L

## 2018-03-07 LAB — POCT GLYCOSYLATED HEMOGLOBIN (HGB A1C): HEMOGLOBIN A1C: 10

## 2018-03-07 MED ORDER — OMEPRAZOLE 20 MG PO CPDR: 20.0000 mg | DELAYED_RELEASE_CAPSULE | Freq: Every day | ORAL | 3 refills | Status: DC

## 2018-03-07 NOTE — Progress Notes (Signed)
Subjective:   Patient ID: Luke Abbott, male    DOB: 1976/08/04, 42 y.o.   MRN: [id number]  Luke BUFFA is a pleasant 42 y.o. year old male who presents to clinic today with Cough (started Dec. would come and go. OTC was not helping.Sometimes productive. With work he inhale "black soot" )  on 03/07/2018  HPI:  Cough- intermittent since December. OTC rxs have not helped much.  Sometimes cough is productive- he works with black soot.  Cough wakes him up at night and occurs in the morning as well.  Otherwise, he doesn't notice the cough. Not better on weekends when he is not working.  Upon questioning, he does sometimes feel a burning and acid in his throat in the morning. Current Outpatient Medications on File Prior to Visit  Medication Sig Dispense Refill  . glipiZIDE (GLUCOTROL) 5 MG tablet glipizide 5 mg tablet    . glucose blood (ONE TOUCH ULTRA TEST) test strip 1 each by Other route 2 (two) times daily. Use as instructed 50 each 12  . HYDROcodone-acetaminophen (NORCO) 5-325 MG tablet Norco 5 mg-325 mg tablet  one-two, PO, q 4 h, PRn pain    . insulin glargine (LANTUS) 100 UNIT/ML injection Lantus U-100 Insulin 100 unit/mL subcutaneous solution    . Insulin Glargine-Lixisenatide 100-33 UNT-MCG/ML SOPN Inject into the skin.    . Insulin Pen Needle (BD PEN NEEDLE NANO U/F) 32G X 4 MM MISC Use once daily for insulin administration 30 each 6  . lisinopril (PRINIVIL,ZESTRIL) 2.5 MG tablet Take by mouth.    . metFORMIN (GLUCOPHAGE) 1000 MG tablet metformin 1,000 mg tablet    . metFORMIN (GLUCOPHAGE-XR) 500 MG 24 hr tablet Take by mouth.    Marland Kitchen MISC NATURAL PRODUCTS PO Take 3 tablets by mouth daily. Glucose Support    . naproxen (NAPROSYN) 500 MG tablet naproxen 500 mg tablet  TAKE 1 TABLET BY MOUTH 2 TIMES A DAY    . rosuvastatin (CRESTOR) 5 MG tablet Take by mouth.    . sitaGLIPtin (JANUVIA) 100 MG tablet Januvia 100 mg tablet    . traMADol (ULTRAM) 50 MG tablet tramadol 50 mg tablet      No current facility-administered medications on file prior to visit.     No Known Allergies  Past Medical History:  Diagnosis Date  . Diabetes mellitus     Past Surgical History:  Procedure Laterality Date  . GANGLION CYST EXCISION  1993   wrist  . IRRIGATION AND DEBRIDEMENT ABSCESS Right 10/05/2013   Procedure: IRRIGATION AND DEBRIDEMENT RIGHT CHEST WALL ABCESS;  Surgeon: Valarie Merino, MD;  Location: [facility];  Service: General;  Laterality: Right;  . SHOULDER ARTHROSCOPY WITH ROTATOR CUFF REPAIR      Family History  Problem Relation Age of Onset  . Hypertension Father   . Kidney disease Mother   . Diabetes Mother     Social History   Socioeconomic History  . Marital status: Legally Separated    Spouse name: Luke Abbott  . Number of children: 7  . Years of education: Not on file  . Highest education level: Not on file  Occupational History    Employer: [employer]  Social Needs  . Financial resource strain: Not on file  . Food insecurity:    Worry: Not on file    Inability: Not on file  . Transportation needs:    Medical: Not on file    Non-medical: Not on file  Tobacco Use  .  Smoking status: Never Smoker  . Smokeless tobacco: Never Used  Substance and Sexual Activity  . Alcohol use: Yes    Comment: socially  . Drug use: No  . Sexual activity: Yes  Lifestyle  . Physical activity:    Days per week: Not on file    Minutes per session: Not on file  . Stress: Not on file  Relationships  . Social connections:    Talks on phone: Not on file    Gets together: Not on file    Attends religious service: Not on file    Active member of club or organization: Not on file    Attends meetings of clubs or organizations: Not on file    Relationship status: Not on file  . Intimate partner violence:    Fear of current or ex partner: Not on file    Emotionally abused: Not on file    Physically abused: Not on file    Forced sexual activity: Not on file  Other  Topics Concern  . Not on file  Social History Narrative  . Not on file   The PMH, PSH, Social History, Family History, Medications, and allergies have been reviewed in [facility], and have been updated if relevant.   Review of Systems  Constitutional: Negative.   HENT: Negative.   Respiratory: Positive for cough. Negative for apnea, choking, chest tightness, shortness of breath, wheezing and stridor.   Cardiovascular: Negative.   Gastrointestinal: Negative for abdominal pain, anal bleeding, blood in stool, diarrhea, nausea, rectal pain and vomiting.  All other systems reviewed and are negative.      Objective:    BP 134/88 (BP Location: Left Arm, Patient Position: Sitting, Cuff Size: Normal)   Pulse (!) 101   Temp 98.8 F (37.1 C) (Oral)   Ht  (1.803 m)   Wt 231 lb 6.4 oz (105 kg)   SpO2 98%   BMI 32.27 kg/m    Physical Exam  General:  pleasant male in no acute distress Eyes:  PERRL Ears:  External ear exam shows no significant lesions or deformities.  TMs normal bilaterally Hearing is grossly normal bilaterally. Nose:  External nasal examination shows no deformity or inflammation. Nasal mucosa are pink and moist without lesions or exudates. Mouth:  Oral mucosa and oropharynx without lesions or exudates.  Teeth in good repair. Neck:  no carotid bruit or thyromegaly no cervical or supraclavicular lymphadenopathy  Lungs:  Normal respiratory effort, chest expands symmetrically. Lungs are clear to auscultation, no crackles or wheezes. Heart:  Normal rate and regular rhythm. S1 and S2 normal without gallop, murmur, click, rub or other extra sounds. Abdomen:  Bowel sounds positive,abdomen soft and non-tender without masses, organomegaly or hernias noted. Pulses:  R and L posterior tibial pulses are full and equal bilaterally  Extremities:  no edema  Psych:  Good eye contact, not anxious or depressed appearing       Assessment & Plan:   Diabetes mellitus type 2,  uncontrolled, without complications (HCC) - Plan: POCT HgB A1C, POCT UA - Microalbumin  Cough present for greater than 3 weeks No follow-ups on file.

## 2018-03-07 NOTE — Assessment & Plan Note (Signed)
Lung clear on exam and cough sounds consistent with GERD related symptoms at this time.  I did advise him to wear a mask at work and if cough persists, we should get CXR. Will start omeprazole 20 mg nightly. He will update in me a couple of weeks.

## 2018-03-07 NOTE — Patient Instructions (Signed)
Great to see you.  We are starting Omeprazole 20 mg nightly before bed.  Keep me updated.

## 2019-03-30 ENCOUNTER — Ambulatory Visit (INDEPENDENT_AMBULATORY_CARE_PROVIDER_SITE_OTHER): Payer: Managed Care, Other (non HMO) | Admitting: Nurse Practitioner

## 2019-03-30 ENCOUNTER — Other Ambulatory Visit: Payer: Self-pay

## 2019-03-30 ENCOUNTER — Ambulatory Visit: Payer: Self-pay | Admitting: *Deleted

## 2019-03-30 ENCOUNTER — Encounter: Payer: Self-pay | Admitting: Nurse Practitioner

## 2019-03-30 VITALS — BP 122/76 | Temp 97.5°F | Ht 71.0 in

## 2019-03-30 DIAGNOSIS — K909 Intestinal malabsorption, unspecified: Secondary | ICD-10-CM | POA: Diagnosis not present

## 2019-03-30 DIAGNOSIS — R197 Diarrhea, unspecified: Secondary | ICD-10-CM | POA: Diagnosis not present

## 2019-03-30 DIAGNOSIS — K9049 Malabsorption due to intolerance, not elsewhere classified: Secondary | ICD-10-CM

## 2019-03-30 MED ORDER — LACTASE 3000 UNITS PO TABS: 3000.0000 [IU] | ORAL_TABLET | Freq: Three times a day (TID) | ORAL | 1 refills | Status: DC

## 2019-03-30 MED ORDER — LACTASE 3000 UNITS PO TABS: 3000.0000 [IU] | ORAL_TABLET | Freq: Three times a day (TID) | ORAL | 1 refills | Status: AC

## 2019-03-30 NOTE — Progress Notes (Signed)
Virtual Visit via Video Note  I connected with Luke Abbott on 03/30/19 at  4:00 PM EDT by a video enabled telemedicine application and verified that I am speaking with the correct person using two identifiers.  Location: Patient: Home Provider: office  Wife present during video call I discussed the limitations of evaluation and management by telemedicine and the availability of in person appointments. The patient expressed understanding and agreed to proceed.  CC: pt is complaining of water diarrhea,vomit,bloated,gas,discomfor in abd and chest area,salty taste in mouth when eat dairy products, this is going for along time on and off--saw Dr. Dayton Martes for this before but never f/u--felt like this get worse when start to take jardiance.    History of Present Illness: Diarrhea   This is a chronic problem. The current episode started more than 1 month ago (onset 69months ago). The problem occurs less than 2 times per day. The problem has been waxing and waning. The stool consistency is described as watery. The patient states that diarrhea does not awaken him from sleep. Associated symptoms include bloating and increased flatus. Pertinent negatives include no abdominal pain, chills, coughing, vomiting or weight loss. Associated symptoms comments: No blood in stool, no mucus in stool, no undigested food, no focal ABD pain.  He described abd discomfort as bloating and increased flatulence.. The symptoms are aggravated by dairy products. There are no known risk factors. He has tried bismuth subsalicylate for the symptoms. The treatment provided no relief. There is no history of bowel resection, inflammatory bowel disease, irritable bowel syndrome, malabsorption or a recent abdominal surgery.  reports he is able to tolerate 2% milk , but symptoms are triggered by cheese or ice cream. Also experiences symptoms with high fatty meals. Gallbladder still present.  he initially thought symptoms were related to use  of ozempic injections (started 1year ago by endocrinology, onset of symptoms 6,omths after that)  Reviewed medication amd problem list.  Observations/Objective: Physical Exam  Constitutional: He is oriented to person, place, and time. No distress.  Pulmonary/Chest: Effort normal.  Abdominal: He exhibits no distension. There is no abdominal tenderness.  Neurological: He is alert and oriented to person, place, and time.  Vitals reviewed.   Assessment and Plan: Charleton was seen today for diarrhea.  Diagnoses and all orders for this visit:  Dairy product intolerance -     Discontinue: lactase (LACTAID) 3000 units tablet; Take 1 tablet (3,000 Units total) by mouth 3 (three) times daily with meals. -     lactase (LACTAID) 3000 units tablet; Take 1 tablet (3,000 Units total) by mouth 3 (three) times daily with meals. Take with first bite of food  Diarrhea, unspecified type   Follow Up Instructions: Advised to avoid high fatty and dairy food items. Use lactaid tablet if decides to eat above food items. Call office if no improvement with lactaid and diet modification. Consider GI pathology (culture and lactoferrin) and evaluation of gallbladder function.   I discussed the assessment and treatment plan with the patient. The patient was provided an opportunity to ask questions and all were answered. The patient agreed with the plan and demonstrated an understanding of the instructions.   The patient was advised to call back or seek an in-person evaluation if the symptoms worsen or if the condition fails to improve as anticipated.   Alysia Penna, NP

## 2019-03-30 NOTE — Telephone Encounter (Signed)
Per initial encounter, "Wife calling this am for her husband, who is on the way back home from work because he has had constant on and off diarrhea for several days, along with terrible gas. She states he tried to go to work today, and cannot stay. She is calling for him because he called her and told her he needs to see a doctor"; contacted pt regarding his symptoms; he has complaints of diarrhea, gas, and voimting when he eats diary products; the pt says that he was treated for this but did not follow up; he says that for the past few days vomiting is his most aggrevating symptoms; recommendations made per nurse triage protocol; will route to office for scheduling; the pt can be contacted at [phone].  Reason for Disposition . [1] MILD vomiting with diarrhea AND [2] present > 5 days  Answer Assessment - Initial Assessment Questions 1. VOMITING SEVERITY: "How many times have you vomited in the past 24 hours?"     - MILD:  1 - 2 times/day    - MODERATE: 3 - 5 times/day, decreased oral intake without significant weight loss or symptoms of dehydration    - SEVERE: 6 or more times/day, vomits everything or nearly everything, with significant weight loss, symptoms of dehydration      1 2. ONSET: "When did the vomiting begin?"      1 year ago 3. FLUIDS: "What fluids or food have you vomited up today?" "Have you been able to keep any fluids down?"     No vomiting today 4. ABDOMINAL PAIN: "Are your having any abdominal pain?" If yes : "How bad is it and what does it feel like?" (e.g., crampy, dull, intermittent, constant)      Gas pains, intermitent 5. DIARRHEA: "Is there any diarrhea?" If so, ask: "How many times today?"      0 6. CONTACTS: "Is there anyone else in the family with the same symptoms?"      no 7. CAUSE: "What do you think is causing your vomiting?"   Diary products 8. HYDRATION STATUS: "Any signs of dehydration?" (e.g., dry mouth [not only dry lips], too weak to stand) "When did  you last urinate?"     np 9. OTHER SYMPTOMS: "Do you have any other symptoms?" (e.g., fever, headache, vertigo, vomiting blood or coffee grounds, recent head injury)     bloating 10. PREGNANCY: "Is there any chance you are pregnant?" "When was your last menstrual period?"       n/a  Protocols used: [facility]

## 2019-03-30 NOTE — Patient Instructions (Addendum)
Advised to avoid high fatty and dairy food items. Use lactaid tablet if decides to eat above food items. Call office if no improvement with lactaid and diet modification.  Consider GI pathology (culture and lactoferrin) and evaluation of gallbladder?

## 2019-03-30 NOTE — Telephone Encounter (Signed)
Spoke with Joni Reining at [facility] to verify that pt had been scheduled per previous nurse triage; she states that she will contact the pt.

## 2020-04-17 ENCOUNTER — Ambulatory Visit: Payer: Managed Care, Other (non HMO)

## 2020-04-17 DIAGNOSIS — Z23 Encounter for immunization: Secondary | ICD-10-CM

## 2020-04-17 NOTE — Progress Notes (Signed)
   Covid-19 Vaccination Clinic  Name:  Luke Abbott    MRN: [id number] DOB: 1976-05-01  04/17/2020  Mr. [name] was observed post Covid-19 immunization for 15 minutes without incident. He was provided with Vaccine Information Sheet and instruction to access the V-Safe system.   Mr. [name] was instructed to call 911 with any severe reactions post vaccine: Marland Kitchen Difficulty breathing  . Swelling of face and throat  . A fast heartbeat  . A bad rash all over body  . Dizziness and weakness   Immunizations Administered    Name Date Dose VIS Date Route   Pfizer COVID-19 Vaccine 04/17/2020  3:12 PM 0.3 mL 12/27/2018 Intramuscular   Manufacturer: [employer], [employer]   Lot: [id number]   NDC: [address]-1

## 2020-05-15 ENCOUNTER — Ambulatory Visit: Payer: Managed Care, Other (non HMO) | Attending: Family

## 2020-05-15 DIAGNOSIS — Z23 Encounter for immunization: Secondary | ICD-10-CM

## 2020-05-15 NOTE — Progress Notes (Signed)
   Covid-19 Vaccination Clinic  Name:  Luke Abbott    MRN: [id number] DOB: [birth year]  05/15/2020  [name] was observed post Covid-19 immunization for 15 minutes without incident. He was provided with Vaccine Information Sheet and instruction to access the V-Safe system.   [name] was instructed to call 911 with any severe reactions post vaccine: Marland Kitchen Difficulty breathing  . Swelling of face and throat  . A fast heartbeat  . A bad rash all over body  . Dizziness and weakness   Immunizations Administered    Name Date Dose VIS Date Route   Pfizer COVID-19 Vaccine 05/15/2020  2:47 PM 0.3 mL 12/27/2018 Intramuscular   Manufacturer: [employer], [employer]   Lot: [id number]   NDC: [address]-3
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
## 2020-08-23 ENCOUNTER — Other Ambulatory Visit: Payer: Self-pay

## 2020-08-23 ENCOUNTER — Encounter (INDEPENDENT_AMBULATORY_CARE_PROVIDER_SITE_OTHER): Payer: Self-pay | Admitting: Ophthalmology

## 2020-08-23 ENCOUNTER — Ambulatory Visit (INDEPENDENT_AMBULATORY_CARE_PROVIDER_SITE_OTHER): Payer: Managed Care, Other (non HMO) | Admitting: Ophthalmology

## 2020-08-23 DIAGNOSIS — E113313 Type 2 diabetes mellitus with moderate nonproliferative diabetic retinopathy with macular edema, bilateral: Secondary | ICD-10-CM | POA: Diagnosis not present

## 2020-08-23 DIAGNOSIS — H3581 Retinal edema: Secondary | ICD-10-CM

## 2020-08-23 DIAGNOSIS — H35033 Hypertensive retinopathy, bilateral: Secondary | ICD-10-CM

## 2020-08-23 DIAGNOSIS — I10 Essential (primary) hypertension: Secondary | ICD-10-CM | POA: Diagnosis not present

## 2020-10-04 ENCOUNTER — Encounter (INDEPENDENT_AMBULATORY_CARE_PROVIDER_SITE_OTHER): Payer: Managed Care, Other (non HMO) | Admitting: Ophthalmology

## 2020-10-04 DIAGNOSIS — H3581 Retinal edema: Secondary | ICD-10-CM

## 2020-10-04 DIAGNOSIS — H35033 Hypertensive retinopathy, bilateral: Secondary | ICD-10-CM

## 2020-10-04 DIAGNOSIS — I10 Essential (primary) hypertension: Secondary | ICD-10-CM

## 2020-10-04 DIAGNOSIS — E113313 Type 2 diabetes mellitus with moderate nonproliferative diabetic retinopathy with macular edema, bilateral: Secondary | ICD-10-CM
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
## 2020-11-26 ENCOUNTER — Encounter (INDEPENDENT_AMBULATORY_CARE_PROVIDER_SITE_OTHER): Payer: Managed Care, Other (non HMO) | Admitting: Ophthalmology

## 2020-11-26 DIAGNOSIS — H3581 Retinal edema: Secondary | ICD-10-CM

## 2020-11-26 DIAGNOSIS — E113313 Type 2 diabetes mellitus with moderate nonproliferative diabetic retinopathy with macular edema, bilateral: Secondary | ICD-10-CM

## 2020-11-26 DIAGNOSIS — H35033 Hypertensive retinopathy, bilateral: Secondary | ICD-10-CM

## 2020-11-26 DIAGNOSIS — I10 Essential (primary) hypertension: Secondary | ICD-10-CM

## 2024-02-11 ENCOUNTER — Ambulatory Visit: Payer: Self-pay | Admitting: General Practice

## 2024-02-11 NOTE — Telephone Encounter (Signed)
 Copied from CRM [id number]. Topic: Clinical - Red Word Triage >> Feb 11, 2024  8:28 AM Nyra Capes wrote: Red Word that prompted transfer to Nurse Triage: patient wife [name] calling in,Luke Abbott is at work now. starting around Monday 02/07/24 came on all of the sudden, left flank pain that radiates to right side. 7 pain scale. Patient says it's kidney pain. Patient was seeing Dr Corena Pilgrim name)  before leaving the practice  Patient is looking to establish care with New PCP  Patient wife [name] phone [phone] Patient phone # [phone]   Chief Complaint: flank pain Symptoms: lower back pain Frequency: comes and goes Pertinent Negatives: Patient denies fever Disposition: [] ED /[x] Urgent Care (no appt availability in office) / [] Appointment(In office/virtual)/ []  [facility]/ [] Home Care/ [] Refused Recommended Disposition /[] [facility]/ []  Follow-up with PCP Additional Notes: No PCP established, no new patient availability at this time. Advising Urgent Care at this time to address acute symptoms. New Patient OV scheduled in June at [facility]  Reason for Disposition  MODERATE pain (e.g., interferes with normal activities or awakens from sleep)  Answer Assessment - Initial Assessment Questions 1. LOCATION: "Where does it hurt?" (e.g., left, right)     Lower left  2. ONSET: "When did the pain start?"     Last week  3. SEVERITY: "How bad is the pain?" (e.g., Scale 1-10; mild, moderate, or severe)   - MILD (1-3): doesn't interfere with normal activities    - MODERATE (4-7): interferes with normal activities or awakens from sleep    - SEVERE (8-10): excruciating pain and patient unable to do normal activities (stays in bed)       7/10 pain  4. PATTERN: "Does the pain come and go, or is it constant?"      Comes and goes  5. CAUSE: "What do you think is causing the pain?"     Possibly a kidney stone or ozempic  6. OTHER SYMPTOMS:  "Do you have any other symptoms?"  (e.g., fever, abdomen pain, vomiting, leg weakness, burning with urination, blood in urine)     Diarrhea  7. PREGNANCY:  "Is there any chance you are pregnant?" "When was your last menstrual period?"     .  Protocols used: Flank Pain-A-AH

## 2024-04-17 ENCOUNTER — Ambulatory Visit: Admitting: Family Medicine

## 2024-04-25 ENCOUNTER — Ambulatory Visit: Admitting: Orthopaedic Surgery

## 2024-05-12 ENCOUNTER — Ambulatory Visit: Admitting: Physician Assistant

## 2024-05-16 ENCOUNTER — Ambulatory Visit: Admitting: Orthopaedic Surgery

## 2024-06-10 NOTE — Progress Notes (Deleted)
 New patient visit  Patient: Luke Abbott   DOB: [birth year]   48 y.o. Male  MRN: [id number] Visit Date: 06/15/2024  Today's healthcare provider: Jolynn Spencer, PA-C   No chief complaint on file.  Subjective    Luke Abbott is a 49 y.o. male who presents today as a new patient to establish care.  HPI  *** Discussed the use of AI scribe software for clinical note transcription with the patient, who gave verbal consent to proceed.  History of Present Illness     Past Medical History:  Diagnosis Date  . Diabetes mellitus    Past Surgical History:  Procedure Laterality Date  . GANGLION CYST EXCISION  1993   wrist  . IRRIGATION AND DEBRIDEMENT ABSCESS Right 10/05/2013   Procedure: IRRIGATION AND DEBRIDEMENT RIGHT CHEST WALL ABCESS;  Surgeon: Donnice KATHEE Lunger, MD;  Location: [facility];  Service: General;  Laterality: Right;  . SHOULDER ARTHROSCOPY WITH ROTATOR CUFF REPAIR     Family Status  Relation Name Status  . Father  Alive  . Mother  Deceased at age 39       ESRD  No partnership data on file   Family History  Problem Relation Age of Onset  . Hypertension Father   . Kidney disease Mother   . Diabetes Mother    Social History   Socioeconomic History  . Marital status: Married    Spouse name: Andree  . Number of children: 7  . Years of education: Not on file  . Highest education level: Not on file  Occupational History    Employer: [employer]  Tobacco Use  . Smoking status: Never  . Smokeless tobacco: Never  Substance and Sexual Activity  . Alcohol use: Yes    Comment: socially  . Drug use: No  . Sexual activity: Yes  Other Topics Concern  . Not on file  Social History Narrative  . Not on file   Social Drivers of Health   Financial Resource Strain: Not on file  Food Insecurity: Not on file  Transportation Needs: Not on file  Physical Activity: Not on file  Stress: Not on file  Social Connections: Not on file   Outpatient Medications  Prior to Visit  Medication Sig  . empagliflozin (JARDIANCE) 25 MG TABS tablet TAKE 1 TABLET (25 MG) BY MOUTH ONCE DAILY  . glipiZIDE  (GLUCOTROL ) 5 MG tablet glipizide  5 mg tablet (Patient not taking: Reported on 08/23/2020)  . glucose blood (ONE TOUCH ULTRA TEST) test strip 1 each by Other route 2 (two) times daily. Use as instructed  . HYDROcodone -acetaminophen  (NORCO) 5-325 MG tablet Norco 5 mg-325 mg tablet  one-two, PO, q 4 h, PRn pain (Patient not taking: Reported on 08/23/2020)  . insulin  glargine (LANTUS ) 100 UNIT/ML injection Lantus  U-100 Insulin  100 unit/mL subcutaneous solution (Patient not taking: Reported on 08/23/2020)  . Insulin  Glargine-Lixisenatide 100-33 UNT-MCG/ML SOPN Inject into the skin. (Patient not taking: Reported on 08/23/2020)  . lactase (LACTAID) 3000 units tablet Take 1 tablet (3,000 Units total) by mouth 3 (three) times daily with meals. Take with first bite of food (Patient not taking: Reported on 08/23/2020)  . lisinopril (PRINIVIL,ZESTRIL) 2.5 MG tablet Take by mouth.  . losartan (COZAAR) 50 MG tablet Take by mouth.  . metFORMIN  (GLUCOPHAGE ) 1000 MG tablet metformin  1,000 mg tablet  . MISC NATURAL PRODUCTS PO Take 3 tablets by mouth daily. Glucose Support  . naproxen  (NAPROSYN ) 500 MG tablet naproxen  500 mg tablet  TAKE 1  TABLET BY MOUTH 2 TIMES A DAY (Patient not taking: Reported on 08/23/2020)  . rosuvastatin (CRESTOR) 10 MG tablet Take by mouth.  . rosuvastatin (CRESTOR) 5 MG tablet Take by mouth.  . Semaglutide, 1 MG/DOSE, 2 MG/1.5ML SOPN Inject into the skin.   . traMADol  (ULTRAM ) 50 MG tablet tramadol  50 mg tablet (Patient not taking: Reported on 08/23/2020)   No facility-administered medications prior to visit.   No Known Allergies  Immunization History  Administered Date(s) Administered  . PFIZER(Purple Top)SARS-COV-2 Vaccination 04/17/2020, 05/15/2020    Health Maintenance  Topic Date Due  . HIV Screening  Never done  . Diabetic kidney  evaluation - Urine ACR  Never done  . Hepatitis C Screening  Never done  . DTaP/Tdap/Td (1 - Tdap) Never done  . Pneumococcal Vaccine: 19-49 Years (1 of 2 - PCV) Never done  . Hepatitis B Vaccines (1 of 3 - 19+ 3-dose series) Never done  . OPHTHALMOLOGY EXAM  01/15/2015  . FOOT EXAM  05/11/2015  . Diabetic kidney evaluation - eGFR measurement  04/21/2018  . HEMOGLOBIN A1C  09/07/2018  . Colonoscopy  Never done  . COVID-19 Vaccine (3 - 2024-25 season) 07/04/2023  . INFLUENZA VACCINE  06/02/2024  . HPV VACCINES  Aged Out  . Meningococcal B Vaccine  Aged Out    Patient Care Team: Patient, No Pcp Per as PCP - General (General Practice)  Review of Systems  All other systems reviewed and are negative.  Except see HPI   {Insert previous labs (optional):23779} {See past labs  Heme  Chem  Endocrine  Serology  Results Review (optional):1}   Objective    There were no vitals taken for this visit. {Insert last BP/Wt (optional):23777}{See vitals history (optional):1}   Physical Exam Vitals reviewed.  Constitutional:      General: He is not in acute distress.    Appearance: Normal appearance. He is not diaphoretic.  HENT:     Head: Normocephalic and atraumatic.  Eyes:     General: No scleral icterus.    Conjunctiva/sclera: Conjunctivae normal.  Cardiovascular:     Rate and Rhythm: Normal rate and regular rhythm.     Pulses: Normal pulses.     Heart sounds: Normal heart sounds. No murmur heard. Pulmonary:     Effort: Pulmonary effort is normal. No respiratory distress.     Breath sounds: Normal breath sounds. No wheezing or rhonchi.  Musculoskeletal:     Cervical back: Neck supple.     Right lower leg: No edema.     Left lower leg: No edema.  Lymphadenopathy:     Cervical: No cervical adenopathy.  Skin:    General: Skin is warm and dry.     Findings: No rash.  Neurological:     Mental Status: He is alert and oriented to person, place, and time. Mental status is at  baseline.  Psychiatric:        Mood and Affect: Mood normal.        Behavior: Behavior normal.    Depression Screen    03/07/2018    3:45 PM  PHQ 2/9 Scores  PHQ - 2 Score 0   No results found for any visits on 06/15/24.  Assessment & Plan     *** Assessment and Plan Assessment & Plan      Encounter to establish care Welcomed to our clinic Reviewed past medical hx, social hx, family hx and surgical hx Pt advised to send all vaccination records or screening  No follow-ups on file.    The patient was advised to call back or seek an in-person evaluation if the symptoms worsen or if the condition fails to improve as anticipated.  I discussed the assessment and treatment plan with the patient. The patient was provided an opportunity to ask questions and all were answered. The patient agreed with the plan and demonstrated an understanding of the instructions.  I, Nike Southers, PA-C have reviewed all documentation for this visit. The documentation on  06/15/2024   for the exam, diagnosis, procedures, and orders are all accurate and complete.  Jolynn Spencer, [facility], MMS [facility] [phone] (phone) [phone] (fax)  [facility]

## 2024-06-15 ENCOUNTER — Ambulatory Visit: Admitting: Physician Assistant

## 2024-06-15 DIAGNOSIS — Z7689 Persons encountering health services in other specified circumstances: Secondary | ICD-10-CM

## 2024-06-15 DIAGNOSIS — K5900 Constipation, unspecified: Secondary | ICD-10-CM

## 2024-06-15 DIAGNOSIS — E119 Type 2 diabetes mellitus without complications: Secondary | ICD-10-CM

## 2024-06-15 DIAGNOSIS — E782 Mixed hyperlipidemia: Secondary | ICD-10-CM

## 2024-07-21 ENCOUNTER — Ambulatory Visit: Admitting: Physician Assistant

## 2024-08-24 ENCOUNTER — Ambulatory Visit: Admitting: Physician Assistant

## 2024-09-04 ENCOUNTER — Encounter: Payer: Self-pay | Admitting: Radiology

## 2024-10-13 ENCOUNTER — Ambulatory Visit: Admitting: Physician Assistant
